# Patient Record
Sex: Male | Born: 1970 | Race: White | Hispanic: No | Marital: Married | Smoking: Never smoker
Health system: Southern US, Community
[De-identification: ages and names within clinical notes are randomized; demographics above are authoritative.]

## PROBLEM LIST (undated history)

## (undated) DIAGNOSIS — J45909 Unspecified asthma, uncomplicated: Secondary | ICD-10-CM

## (undated) HISTORY — PX: CHOLECYSTECTOMY: SHX55

## (undated) HISTORY — DX: Unspecified asthma, uncomplicated: J45.909

## (undated) HISTORY — PX: BILATERAL CARPAL TUNNEL RELEASE: SHX6508

## (undated) HISTORY — PX: APPENDECTOMY: SHX54

---

## 2013-06-17 ENCOUNTER — Emergency Department (HOSPITAL_COMMUNITY)
Admission: EM | Admit: 2013-06-17 | Discharge: 2013-06-17 | Disposition: A | Discharge status: Home / Self Care | Payer: 59 | Attending: Emergency Medicine | Admitting: Emergency Medicine

## 2013-06-17 ENCOUNTER — Encounter (HOSPITAL_COMMUNITY): Payer: Self-pay | Admitting: *Deleted

## 2013-06-17 ENCOUNTER — Emergency Department (HOSPITAL_COMMUNITY): Payer: 59

## 2013-06-17 DIAGNOSIS — Y9389 Activity, other specified: Secondary | ICD-10-CM | POA: Insufficient documentation

## 2013-06-17 DIAGNOSIS — X58XXXA Exposure to other specified factors, initial encounter: Secondary | ICD-10-CM | POA: Insufficient documentation

## 2013-06-17 DIAGNOSIS — Y929 Unspecified place or not applicable: Secondary | ICD-10-CM | POA: Insufficient documentation

## 2013-06-17 DIAGNOSIS — S6000XA Contusion of unspecified finger without damage to nail, initial encounter: Secondary | ICD-10-CM | POA: Insufficient documentation

## 2013-06-17 MED ORDER — HYDROCODONE-ACETAMINOPHEN 5-325 MG PO TABS: ORAL_TABLET | ORAL | Status: DC

## 2013-06-17 MED ORDER — ONDANSETRON HCL 4 MG PO TABS
4.0000 mg | ORAL_TABLET | Freq: Once | ORAL | Status: AC
  Administered 2013-06-17: 4 mg via ORAL
  Filled 2013-06-17: qty 1

## 2013-06-17 MED ORDER — KETOROLAC TROMETHAMINE 10 MG PO TABS
10.0000 mg | ORAL_TABLET | Freq: Once | ORAL | Status: AC
  Administered 2013-06-17: 10 mg via ORAL
  Filled 2013-06-17: qty 1

## 2013-06-17 MED ORDER — HYDROCODONE-ACETAMINOPHEN 5-325 MG PO TABS
2.0000 | ORAL_TABLET | Freq: Once | ORAL | Status: AC
  Administered 2013-06-17: 2 via ORAL
  Filled 2013-06-17: qty 2

## 2013-06-17 MED ORDER — DICLOFENAC SODIUM 75 MG PO TBEC: 75.0000 mg | DELAYED_RELEASE_TABLET | Freq: Two times a day (BID) | ORAL | Status: DC

## 2013-06-17 NOTE — ED Provider Notes (Signed)
History    CSN: [id number] Arrival date & time 06/17/13  2148  First MD Initiated Contact with Patient 06/17/13 2219     Chief Complaint  Patient presents with  . Hand Pain   (Consider location/radiation/quality/duration/timing/severity/associated sxs/prior Treatment) HPI Comments: Patient was working with a crossbow when he injured the right thumb. The patient states that throughout the day he has noticed increasing swelling and increasing pain. He presents at this time to have it evaluated for possible fracture. The patient denies being on any platelet altering medications. He does not have any bleeding disorders. He has not had any previous operations or procedures involving the right hand or right thumb. No other fingers were injured from this incident.  The history is provided by the patient.   History reviewed. No pertinent past medical history. Past Surgical History  Procedure Laterality Date  . Cholecystectomy    . Appendectomy     History reviewed. No pertinent family history. History  Substance Use Topics  . Smoking status: Not on file  . Smokeless tobacco: Current User  . Alcohol Use: Yes     Comment: occasional    Review of Systems  Constitutional: Negative for activity change.       All ROS Neg except as noted in HPI  HENT: Negative for nosebleeds and neck pain.   Eyes: Negative for photophobia and discharge.  Respiratory: Negative for cough, shortness of breath and wheezing.   Cardiovascular: Negative for chest pain and palpitations.  Gastrointestinal: Negative for abdominal pain and blood in stool.  Genitourinary: Negative for dysuria, frequency and hematuria.  Musculoskeletal: Negative for back pain and arthralgias.  Skin: Negative.   Neurological: Negative for dizziness, seizures and speech difficulty.  Psychiatric/Behavioral: Negative for hallucinations and confusion.    Allergies  Review of patient's allergies indicates no known allergies.  Home  Medications   Current Outpatient Rx  Name  Route  Sig  Dispense  Refill  . diclofenac (VOLTAREN) 75 MG EC tablet   Oral   Take 1 tablet (75 mg total) by mouth 2 (two) times daily.   12 tablet   0   . HYDROcodone-acetaminophen (NORCO/VICODIN) 5-325 MG per tablet      1 or 2 po q4h prn pain   20 tablet   0    BP 129/88  Pulse 66  Temp(Src) 98.7 F (37.1 C) (Oral)  Resp 16  Ht 5\' 8"  (1.727 m)  Wt 215 lb (97.523 kg)  BMI 32.7 kg/m2  SpO2 98% Physical Exam  Nursing note and vitals reviewed. Constitutional: He is oriented to person, place, and time. He appears well-developed and well-nourished.  Non-toxic appearance.  HENT:  Head: Normocephalic.  Right Ear: Tympanic membrane and external ear normal.  Left Ear: Tympanic membrane and external ear normal.  Eyes: EOM and lids are normal. Pupils are equal, round, and reactive to light.  Neck: Normal range of motion. Neck supple. Carotid bruit is not present.  Cardiovascular: Normal rate, regular rhythm, normal heart sounds, intact distal pulses and normal pulses.   Pulmonary/Chest: Breath sounds normal. No respiratory distress.  Abdominal: Soft. Bowel sounds are normal. There is no tenderness. There is no guarding.  Musculoskeletal: Normal range of motion.  There is full range of motion of the right shoulder elbow and wrists. There is pain to palpation medial more than lateral of the right thumb. There is no subungual hematoma appreciated at this time. There is good range of motion of the DIP. No swelling or  hematoma in the web space.  Lymphadenopathy:       Head (right side): No submandibular adenopathy present.       Head (left side): No submandibular adenopathy present.    He has no cervical adenopathy.  Neurological: He is alert and oriented to person, place, and time. He has normal strength. No cranial nerve deficit or sensory deficit.  Skin: Skin is warm and dry.  Psychiatric: He has a normal mood and affect. His speech is  normal.    ED Course  Procedures (including critical care time) Labs Reviewed - No data to display Dg Finger Thumb Right  06/17/2013   *RADIOLOGY REPORT*  Clinical Data: Right thumb injury, pain.  RIGHT THUMB 2+V  Comparison: None.  Findings: Imaged bones, joints and soft tissues appear normal.  IMPRESSION: Negative exam.   Original Report Authenticated By: Holley Dexter, M.D.   1. Contusion of finger of right hand, initial encounter     MDM  I have reviewed nursing notes, vital signs, and all appropriate lab and imaging results for this patient. Patient was shooting a crossbow and injured the right thumb. X-ray of the right thumb is negative for fracture or dislocation. Patient advised to apply ice, use diclofenac 2 times daily with food for inflammation, use Norco every 4 hours as needed for pain. Patient advised to see the orthopedic specialist if not improving.  Kathie Dike, PA-C 06/17/13 2334

## 2013-06-17 NOTE — ED Notes (Signed)
Pt with swelling and pain to right thumb after using a crossbow, unable to bend thumb, refuses ice pack, states that he put ice on it earlier and made pain worse per pt.

## 2013-06-17 NOTE — ED Notes (Signed)
Pt reports hurting right thumb when shooting a crossbow.  Right thumb swollen, no obvious deformity at this time.

## 2013-06-17 NOTE — ED Notes (Signed)
Discharge instructions given and reviewed with patient.  Prescriptions given for Voltaren and Hydrocodone; effects and use explained.  Patient verbalized understanding to take medications as directed, sedating effects of Hydrocodone and to follow up with orthopedics as needed.  Patient ambulatory; discharged home in good condition.

## 2013-06-18 NOTE — ED Provider Notes (Signed)
Medical screening examination/treatment/procedure(s) were performed by non-physician practitioner and as supervising physician I was immediately available for consultation/collaboration.   Joya Gaskins, MD 06/18/13 [phone]

## 2014-03-26 ENCOUNTER — Ambulatory Visit (HOSPITAL_COMMUNITY)
Admission: RE | Admit: 2014-03-26 | Discharge: 2014-03-26 | Disposition: A | Discharge status: Home / Self Care | Payer: 59 | Source: Ambulatory Visit | Attending: Orthopedic Surgery | Admitting: Orthopedic Surgery

## 2014-03-26 ENCOUNTER — Other Ambulatory Visit (HOSPITAL_COMMUNITY): Payer: Self-pay | Admitting: Orthopedic Surgery

## 2014-03-26 DIAGNOSIS — M545 Low back pain, unspecified: Secondary | ICD-10-CM

## 2014-03-26 DIAGNOSIS — Z01818 Encounter for other preprocedural examination: Secondary | ICD-10-CM | POA: Insufficient documentation

## 2014-03-26 DIAGNOSIS — Z77018 Contact with and (suspected) exposure to other hazardous metals: Secondary | ICD-10-CM | POA: Insufficient documentation

## 2016-12-18 HISTORY — PX: SHOULDER SURGERY: SHX246

## 2019-09-05 ENCOUNTER — Telehealth: Payer: Self-pay

## 2019-09-05 NOTE — Telephone Encounter (Signed)
NOTES ON FILE FROM DR Quillian Quince [facility] [phone], SENT REFERRAL TO SCHEDULING

## 2019-10-02 ENCOUNTER — Other Ambulatory Visit: Payer: Self-pay

## 2019-10-02 ENCOUNTER — Encounter: Payer: Self-pay | Admitting: Cardiovascular Disease

## 2019-10-02 ENCOUNTER — Ambulatory Visit: Payer: BC Managed Care – PPO | Admitting: Cardiovascular Disease

## 2019-10-02 VITALS — BP 114/78 | HR 72 | Temp 97.7°F | Ht 67.0 in | Wt 234.0 lb

## 2019-10-02 DIAGNOSIS — R0600 Dyspnea, unspecified: Secondary | ICD-10-CM | POA: Diagnosis not present

## 2019-10-02 DIAGNOSIS — R0609 Other forms of dyspnea: Secondary | ICD-10-CM | POA: Insufficient documentation

## 2019-10-02 DIAGNOSIS — R079 Chest pain, unspecified: Secondary | ICD-10-CM

## 2019-10-02 LAB — CBC
Hematocrit: 44.2 % (ref 37.5–51.0)
Hemoglobin: 14.9 g/dL (ref 13.0–17.7)
MCH: 30.3 pg (ref 26.6–33.0)
MCHC: 33.7 g/dL (ref 31.5–35.7)
MCV: 90 fL (ref 79–97)
Platelets: 237 10*3/uL (ref 150–450)
RBC: 4.92 x10E6/uL (ref 4.14–5.80)
RDW: 11.9 % (ref 11.6–15.4)
WBC: 7 10*3/uL (ref 3.4–10.8)

## 2019-10-02 LAB — BASIC METABOLIC PANEL
BUN/Creatinine Ratio: 12 (ref 9–20)
BUN: 11 mg/dL (ref 6–24)
CO2: 23 mmol/L (ref 20–29)
Calcium: 9 mg/dL (ref 8.7–10.2)
Chloride: 100 mmol/L (ref 96–106)
Creatinine, Ser: 0.91 mg/dL (ref 0.76–1.27)
GFR calc Af Amer: 115 mL/min/{1.73_m2} (ref >59)
GFR calc non Af Amer: 99 mL/min/{1.73_m2} (ref >59)
Glucose: 108 mg/dL — ABNORMAL HIGH (ref 65–99)
Potassium: 4 mmol/L (ref 3.5–5.2)
Sodium: 139 mmol/L (ref 134–144)

## 2019-10-02 MED ORDER — METOPROLOL TARTRATE 100 MG PO TABS: 100.0000 mg | ORAL_TABLET | Freq: Once | ORAL | 0 refills | Status: DC

## 2019-10-02 NOTE — Progress Notes (Signed)
10/02/2019 Waynard Reeds   July 12, 1971  [id number]  Primary Physician Patient, No Pcp Per Primary Cardiologist: Lorretta Harp MD [name], [place]  HPI:  Harsha Alcivar is a 48 y.o. mildly overweight married Caucasian male father of 65, grandfather 2 grandchildren who works as a Therapist, occupational at [employer] in [place].  He was referred by his PCP, Michell Heinrich DO for cardiovascular dilation because of chest pain and shortness of breath.  He has no cardiac risk factors other than family history.  His father died of a myocardial infarction at age 33.  Is never had a heart attack or stroke.  He does not smoke nor is he diabetic, hyperlipidemic or hypertensive.  He noticed increasing dyspnea on exertion over the last 6 months with more recent onset of chest pain with exertion.  He had a fairly severe episode this past Monday when walking up an incline with substernal chest pain rating to his back.   Current Meds  Medication Sig  . albuterol (VENTOLIN HFA) 108 (90 Base) MCG/ACT inhaler USE AS DIRECTED  . Azelastine-Fluticasone 137-50 MCG/ACT SUSP azelastine 137 mcg (0.1 %) nasal spray aerosol  . diclofenac (VOLTAREN) 75 MG EC tablet Take 1 tablet (75 mg total) by mouth 2 (two) times daily.  . fluticasone furoate-vilanterol (BREO ELLIPTA) 200-25 MCG/INH AEPB USE AS DIRECTED  . montelukast (SINGULAIR) 10 MG tablet Take 10 mg by mouth at bedtime.     No Known Allergies  Social History   Socioeconomic History  . Marital status: Married    Spouse name: Not on file  . Number of children: Not on file  . Years of education: Not on file  . Highest education level: Not on file  Occupational History  . Not on file  Social Needs  . Financial resource strain: Not on file  . Food insecurity    Worry: Not on file    Inability: Not on file  . Transportation needs    Medical: Not on file    Non-medical: Not on file  Tobacco Use  . Smoking status: Never Smoker  .  Smokeless tobacco: Current User    Types: Chew  Substance and Sexual Activity  . Alcohol use: Yes    Comment: occasional  . Drug use: No  . Sexual activity: Not on file  Lifestyle  . Physical activity    Days per week: Not on file    Minutes per session: Not on file  . Stress: Not on file  Relationships  . Social Herbalist on phone: Not on file    Gets together: Not on file    Attends religious service: Not on file    Active member of club or organization: Not on file    Attends meetings of clubs or organizations: Not on file    Relationship status: Not on file  . Intimate partner violence    Fear of current or ex partner: Not on file    Emotionally abused: Not on file    Physically abused: Not on file    Forced sexual activity: Not on file  Other Topics Concern  . Not on file  Social History Narrative  . Not on file     Review of Systems: General: negative for chills, fever, night sweats or weight changes.  Cardiovascular: negative for chest pain, dyspnea on exertion, edema, orthopnea, palpitations, paroxysmal nocturnal dyspnea or shortness of breath Dermatological: negative for rash Respiratory: negative for cough or  wheezing Urologic: negative for hematuria Abdominal: negative for nausea, vomiting, diarrhea, bright red blood per rectum, melena, or hematemesis Neurologic: negative for visual changes, syncope, or dizziness All other systems reviewed and are otherwise negative except as noted above.    Blood pressure 114/78, pulse 72, temperature 97.7 F (36.5 C), temperature source Temporal, height 5\' 7"  (1.702 m), weight 234 lb (106.1 kg), SpO2 95 %.  General appearance: alert and no distress Neck: no adenopathy, no carotid bruit, no JVD, supple, symmetrical, trachea midline and thyroid not enlarged, symmetric, no tenderness/mass/nodules Lungs: clear to auscultation bilaterally Heart: regular rate and rhythm, S1, S2 normal, no murmur, click, rub or gallop  Extremities: extremities normal, atraumatic, no cyanosis or edema Pulses: 2+ and symmetric Skin: Skin color, texture, turgor normal. No rashes or lesions Neurologic: Alert and oriented X 3, normal strength and tone. Normal symmetric reflexes. Normal coordination and gait  EKG sinus rhythm at 72 without ST or T wave changes.  I personally reviewed this EKG  ASSESSMENT AND PLAN:   Chest pain of uncertain etiology Mr. Widhalm was referred by Michell Heinrich , DO in [place] for new onset dyspnea and chest pain.  He has no cardiac risk factors other than family history the father died of myocardial infarction at age 51.  He is never had a heart attack or stroke.  He does not smoke.  He is noticed increased dyspnea on exertion over the last 3 to 6 months with increased exertional chest pain more recently.  He had fairly severe episodes that this past Monday while walking up a hill with pain that was substernal rating to his back along with shortness of breath.  I am going to get a 2D echo and a coronary CTA to further evaluate.      Lorretta Harp MD FACP,FACC,FAHA, [facility] 10/02/2019 9:54 AM

## 2019-10-02 NOTE — Patient Instructions (Addendum)
Your cardiac CT will be scheduled at one of the below locations:   [facility] [address] [place], [place] [address] (336) [place] [address] [place], [place] [address] [phone]  If scheduled at [facility], please arrive at the [facility] main entrance of [facility] 30-45 minutes prior to test start time. Proceed to the [facility] Radiology Department (first floor) to check-in and test prep.  If scheduled at [facility], please arrive 15 mins early for check-in and test prep.  Please follow these instructions carefully (unless otherwise directed):  Lab work: Your physician recommends that you HAVE LAB WORK TODAY: BASIC METABOLIC PANEL; COMPLETE BLOOD COUNT  If you have labs (blood work) drawn today and your tests are completely normal, you will receive your results only by: Marland Kitchen MyChart Message (if you have MyChart) OR . A paper copy in the mail If you have any lab test that is abnormal or we need to change your treatment, we will call you to review the results.  Hold all erectile dysfunction medications at least 3 days (72 hrs) prior to test.  On the Night Before the Test: . Be sure to Drink plenty of water. . Do not consume any caffeinated/decaffeinated beverages or chocolate 12 hours prior to your test. . Do not take any antihistamines 12 hours prior to your test.  On the Day of the Test: . Drink plenty of water. Do not drink any water within one hour of the test. . Do not eat any food 4 hours prior to the test. . You may take your regular medications prior to the test.  . Take metoprolol (Lopressor) 100 MG two hours prior to test.       After the Test: . Drink plenty of water. . After receiving IV contrast, you may experience a mild flushed feeling. This is normal. . On occasion, you may experience a mild rash up to 24 hours after the test.  This is not dangerous. If this occurs, you can take Benadryl 25 mg and increase your fluid intake. . If you experience trouble breathing, this can be serious. If it is severe call 911 IMMEDIATELY. If it is mild, please call our office.    Please contact the cardiac imaging nurse navigator should you have any questions/concerns Marchia Bond, RN Navigator Cardiac Imaging [facility] [phone] Office    ___________________________________________________________________  Testing/Procedures: Your physician has requested that you have an echocardiogram. Echocardiography is a painless test that uses sound waves to create images of your heart. It provides your doctor with information about the size and shape of your heart and how well your heart's chambers and valves are working. This procedure takes approximately one hour. There are no restrictions for this procedure. LOCATION: [facility]: [place], [place], [place] [address] TO BE SCHEDULED FOR APPOINTMENT WITHIN THE NEXT 1-2 WEEKS  Follow-Up: At [facility], you and your health needs are our priority.  As part of our continuing mission to provide you with exceptional heart care, we have created designated Provider Care Teams.  These Care Teams include your primary Cardiologist (physician) and Advanced Practice Providers (APPs -  Physician Assistants and Nurse Practitioners) who all work together to provide you with the care you need, when you need it. Marland Kitchen You will need a follow up appointment with Dr. Quay Burow AFTER YOUR ECHOCARDIOGRAM  AND CARDIAC CT. TO BE SCHEDULED

## 2019-10-02 NOTE — Assessment & Plan Note (Signed)
Michael Lyons was referred by Michell Heinrich , DO in [place] for new onset dyspnea and chest pain.  He has no cardiac risk factors other than family history the father died of myocardial infarction at age 48.  He is never had a heart attack or stroke.  He does not smoke.  He is noticed increased dyspnea on exertion over the last 3 to 6 months with increased exertional chest pain more recently.  He had fairly severe episodes that this past Monday while walking up a hill with pain that was substernal rating to his back along with shortness of breath.  I am going to get a 2D echo and a coronary CTA to further evaluate.

## 2019-10-08 ENCOUNTER — Other Ambulatory Visit (HOSPITAL_COMMUNITY): Payer: BC Managed Care – PPO

## 2019-10-08 ENCOUNTER — Ambulatory Visit (HOSPITAL_COMMUNITY): Payer: BC Managed Care – PPO | Attending: Cardiovascular Disease

## 2019-10-08 ENCOUNTER — Other Ambulatory Visit: Payer: Self-pay

## 2019-10-08 DIAGNOSIS — R079 Chest pain, unspecified: Secondary | ICD-10-CM | POA: Diagnosis not present

## 2019-11-03 ENCOUNTER — Telehealth (HOSPITAL_COMMUNITY): Payer: Self-pay | Admitting: Emergency Medicine

## 2019-11-03 ENCOUNTER — Telehealth: Payer: Self-pay | Admitting: Cardiovascular Disease

## 2019-11-03 NOTE — Telephone Encounter (Signed)
Left message on voicemail with name and callback number Justun Anaya RN Navigator Cardiac Imaging [facility] [phone] Office [phone] Cell  

## 2019-11-03 NOTE — Telephone Encounter (Signed)
I have can't find the pill I need to take for my ct on 11-05-19.

## 2019-11-04 ENCOUNTER — Other Ambulatory Visit: Payer: Self-pay

## 2019-11-04 MED ORDER — METOPROLOL TARTRATE 100 MG PO TABS: 100.0000 mg | ORAL_TABLET | Freq: Once | ORAL | 0 refills | Status: DC

## 2019-11-04 NOTE — Telephone Encounter (Signed)
Refill of metoprolol 100 mg sent to pt pharmacy on file. Reviewed cardiac ct instructions with pt. Pt took notes and verbalized understanding

## 2019-11-05 ENCOUNTER — Other Ambulatory Visit: Payer: Self-pay

## 2019-11-05 ENCOUNTER — Ambulatory Visit (HOSPITAL_COMMUNITY)
Admission: RE | Admit: 2019-11-05 | Discharge: 2019-11-05 | Disposition: A | Discharge status: Home / Self Care | Payer: BC Managed Care – PPO | Source: Ambulatory Visit | Attending: Cardiovascular Disease | Admitting: Cardiovascular Disease

## 2019-11-05 DIAGNOSIS — R079 Chest pain, unspecified: Secondary | ICD-10-CM | POA: Diagnosis not present

## 2019-11-05 MED ORDER — IOHEXOL 350 MG/ML SOLN
100.0000 mL | Freq: Once | INTRAVENOUS | Status: AC | PRN
  Administered 2019-11-05: 100 mL via INTRAVENOUS

## 2019-11-05 MED ORDER — NITROGLYCERIN 0.4 MG SL SUBL
SUBLINGUAL_TABLET | SUBLINGUAL | Status: AC
  Filled 2019-11-05: qty 2

## 2019-11-05 MED ORDER — NITROGLYCERIN 0.4 MG SL SUBL
0.8000 mg | SUBLINGUAL_TABLET | Freq: Once | SUBLINGUAL | Status: AC
  Administered 2019-11-05: 0.8 mg via SUBLINGUAL

## 2019-11-06 ENCOUNTER — Ambulatory Visit (HOSPITAL_COMMUNITY): Payer: BC Managed Care – PPO

## 2020-05-26 IMAGING — CT CT HEART MORP W/ CTA COR W/ SCORE W/ CA W/CM &/OR W/O CM
3 of 8 series · 6 of 20 positions shown, 7 images · IV contrast (APPLIED)
Comparison: None.
COMPARISON: None.

Addendum:
EXAM:
OVER-READ INTERPRETATION  CT CHEST

The following report is an over-read performed by radiologist Dr.
Aislinn Pledger [REDACTED] on 11/05/2019. This
over-read does not include interpretation of cardiac or coronary
anatomy or pathology. The coronary CTA interpretation by the
cardiologist is attached.
CLINICAL DATA: Chest pain
Cardiac CTA
MEDICATIONS:
Sub lingual nitro. 4mg x 2
TECHNIQUE: The patient was scanned on a Siemens [REDACTED]ice scanner. Gantry
rotation speed was 250 msecs. Collimation was 0.6 mm. A 100 kV
prospective scan was triggered in the ascending thoracic aorta at
35-75% of the R-R interval. Average HR during the scan was 60 bpm.
The 3D data set was interpreted on a dedicated work station using
MPR, MIP and VRT modes. A total of 80cc of contrast was used.

[Series 11: best syst 37 % · axial · 0.42mm/px · z∈[+1072,+1115]mm · 2 of 323 slices shown, 3 images]
[im 108/323  vessel]
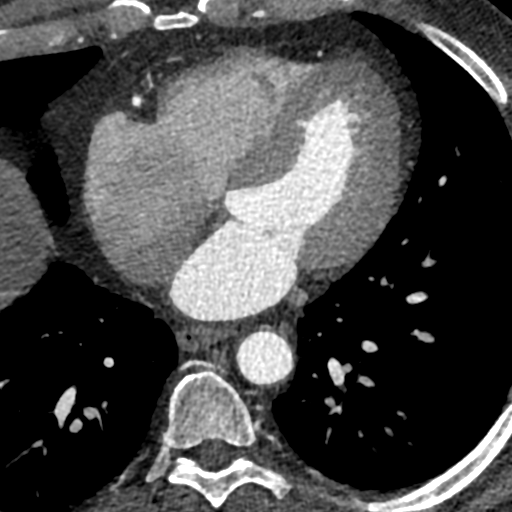
[im 108/323  lung]
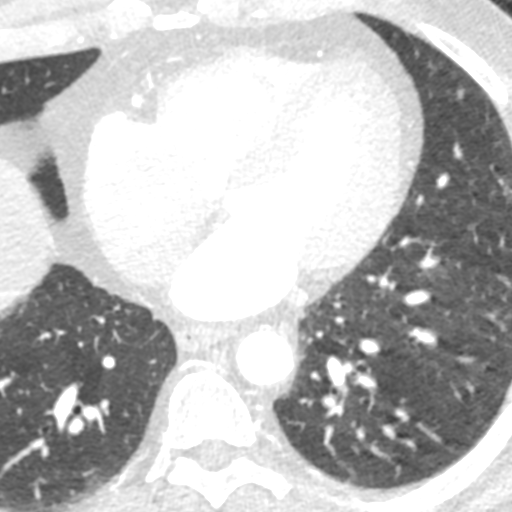
[im 215/323  vessel]
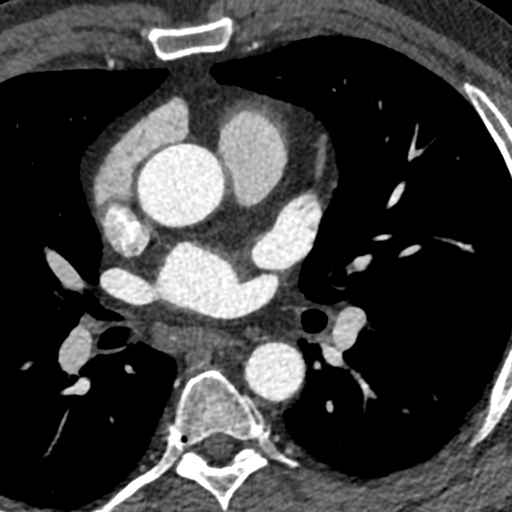

[Series 12: ts diast sharp 37 % · axial · 0.42mm/px · z∈[+1072,+1115]mm · 2 of 323 slices shown]
[im 108/323  lung]
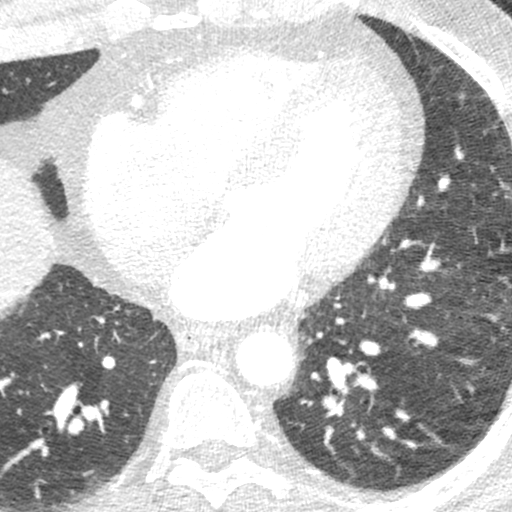
[im 215/323  lung]
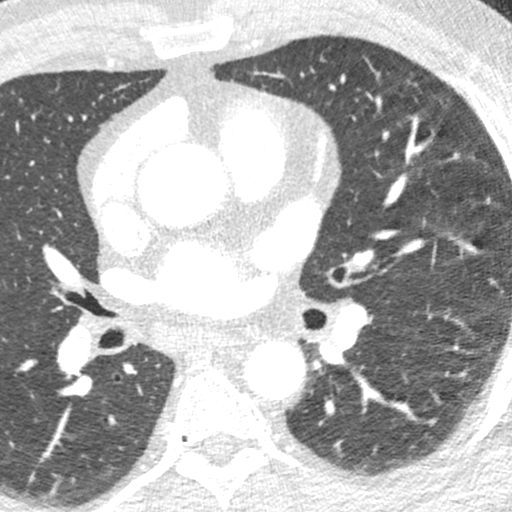

[Series 13: ts syst sharp 37 % · axial · 0.42mm/px · z∈[+1072,+1115]mm · 2 of 323 slices shown]
[im 108/323  lung]
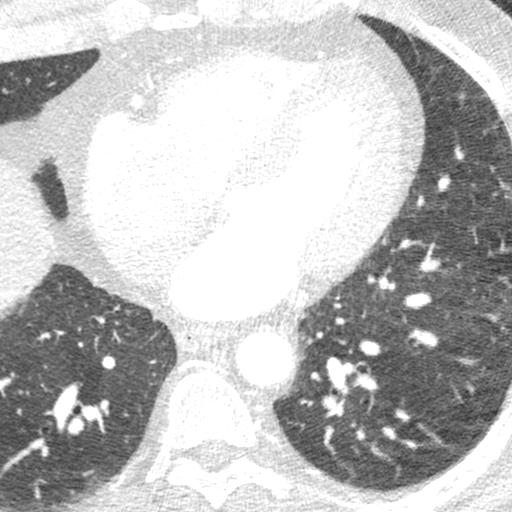
[im 215/323  lung]
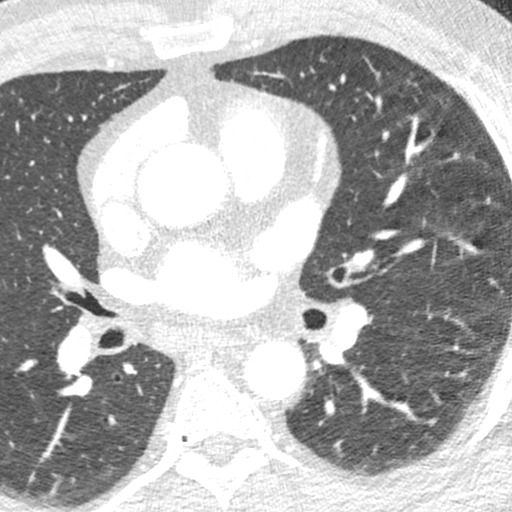

[6 of 20 positions shown; findings below may reference images not displayed]

FINDINGS: Vascular: No incidental findings.

Mediastinum/Nodes: Visualized mediastinum and hilar regions
demonstrate no lymphadenopathy or focal masses.

Lungs/Pleura: Visualized lungs show no evidence of pulmonary edema,
consolidation, pneumothorax, nodule or pleural fluid.

Upper Abdomen: Visualized upper liver demonstrates steatosis.

Musculoskeletal: No chest wall mass or suspicious bone lesions
identified.
IMPRESSION: No significant noncardiac findings in the visualized chest. Hepatic
steatosis.
FINDINGS: Non-cardiac: See separate report from [REDACTED].

Pulmonary veins drain normally to the left atrium.

Calcium Score: 0 Agatston units.

Coronary Arteries: Right dominant with no anomalies

LM: No plaque or stenosis.

LAD system:  No plaque or stenosis.

Circumflex system: No plaque or stenosis.

RCA system: No plaque or stenosis.
IMPRESSION: 1. Coronary artery calcium score 0 Agatston units. This suggests low
risk for future cardiac events.

2.  No significant coronary disease noted.

Irvin Billiot

*** End of Addendum ***
EXAM:
OVER-READ INTERPRETATION  CT CHEST

The following report is an over-read performed by radiologist Dr.
Aislinn Pledger [REDACTED] on 11/05/2019. This
over-read does not include interpretation of cardiac or coronary
anatomy or pathology. The coronary CTA interpretation by the
cardiologist is attached.
FINDINGS: Vascular: No incidental findings.

Mediastinum/Nodes: Visualized mediastinum and hilar regions
demonstrate no lymphadenopathy or focal masses.

Lungs/Pleura: Visualized lungs show no evidence of pulmonary edema,
consolidation, pneumothorax, nodule or pleural fluid.

Upper Abdomen: Visualized upper liver demonstrates steatosis.

Musculoskeletal: No chest wall mass or suspicious bone lesions
identified.
IMPRESSION: No significant noncardiac findings in the visualized chest. Hepatic
steatosis.

## 2020-05-27 ENCOUNTER — Telehealth: Payer: Self-pay | Admitting: Cardiovascular Disease

## 2020-05-27 NOTE — Telephone Encounter (Signed)
Pt c/o swelling: STAT is pt has developed SOB within 24 hours  1) How much weight have you gained and in what time span?  Not sure  2) If swelling, where is the swelling located? Legs and feet really bad-look like the vessels are popping  3) Are you currently taking a fluid pill? no  4) Are you currently SOB? Not much, having chest pains more often  5) Do you have a log of your daily weights (if so, list)? no  6) Have you gained 3 pounds in a day or 5 pounds in a week?  Not sure  7) Have you traveled recently? No- pt wants to be seen asap- 1st appt in person is 06-11-20

## 2020-05-27 NOTE — Telephone Encounter (Signed)
Spoke with patient's wife. No DPR on file, will need patient to call in. Did advise if patient is having chest pain to report to the emergency room.

## 2020-05-28 NOTE — Telephone Encounter (Signed)
Spoke with pt, he continues to have occasional chest pain. If occurs when he is active and will last up to 30 min and goes away with rest. His SOB has not changed as he has asthma. He also is c/o swelling in his feet and legs at the end of the day. He was wearing support socks but the swelling just moved up to the top of the socks. He feels something is not right even though all of his testing has come back normal. Sooner appointment scheduled with dr berry. Patient voiced understanding to go to the ER with any unresolved chest pain or change in symptoms.

## 2020-06-08 ENCOUNTER — Ambulatory Visit: Payer: BC Managed Care – PPO | Admitting: Cardiovascular Disease

## 2020-06-11 ENCOUNTER — Ambulatory Visit: Payer: BC Managed Care – PPO | Admitting: General Practice

## 2020-07-22 ENCOUNTER — Encounter: Payer: Self-pay | Admitting: Cardiovascular Disease

## 2020-07-22 ENCOUNTER — Ambulatory Visit (INDEPENDENT_AMBULATORY_CARE_PROVIDER_SITE_OTHER): Payer: BC Managed Care – PPO | Admitting: Cardiovascular Disease

## 2020-07-22 ENCOUNTER — Other Ambulatory Visit: Payer: Self-pay

## 2020-07-22 VITALS — BP 118/68 | HR 65 | Ht 68.0 in | Wt 232.0 lb

## 2020-07-22 DIAGNOSIS — Z79899 Other long term (current) drug therapy: Secondary | ICD-10-CM

## 2020-07-22 DIAGNOSIS — R600 Localized edema: Secondary | ICD-10-CM

## 2020-07-22 DIAGNOSIS — Z13220 Encounter for screening for lipoid disorders: Secondary | ICD-10-CM | POA: Diagnosis not present

## 2020-07-22 DIAGNOSIS — R0609 Other forms of dyspnea: Secondary | ICD-10-CM

## 2020-07-22 DIAGNOSIS — R0600 Dyspnea, unspecified: Secondary | ICD-10-CM | POA: Diagnosis not present

## 2020-07-22 MED ORDER — HYDROCHLOROTHIAZIDE 12.5 MG PO CAPS: 12.5000 mg | ORAL_CAPSULE | Freq: Every day | ORAL | 3 refills | Status: DC

## 2020-07-22 NOTE — Assessment & Plan Note (Signed)
History of bilateral lower extreme edema left greater than right.  Does have 1-2+ pitting edema.  I suspect he has excessive salt intake and dietary discretion.  His LV function is normal.  I am going to put him on low-dose hydrochlorothiazide 12.5 mg a day and we will check a basic metabolic panel in 2 weeks.  I will see him back in 3 months.

## 2020-07-22 NOTE — Patient Instructions (Addendum)
Medication Instructions:  START- Hydrochlorothiazide 12.5 mg by mouth daily  *If you need a refill on your cardiac medications before your next appointment, please call your pharmacy*   Lab Work: Fasting Lipid and Liver Today BMP in 2 weeks  If you have labs (blood work) drawn today and your tests are completely normal, you will receive your results only by: Marland Kitchen MyChart Message (if you have MyChart) OR . A paper copy in the mail If you have any lab test that is abnormal or we need to change your treatment, we will call you to review the results.   Testing/Procedures: None Ordered   Follow-Up: At [facility], you and your health needs are our priority.  As part of our continuing mission to provide you with exceptional heart care, we have created designated Provider Care Teams.  These Care Teams include your primary Cardiologist (physician) and Advanced Practice Providers (APPs -  Physician Assistants and Nurse Practitioners) who all work together to provide you with the care you need, when you need it.  We recommend signing up for the patient portal called "MyChart".  Sign up information is provided on this After Visit Summary.  MyChart is used to connect with patients for Virtual Visits (Telemedicine).  Patients are able to view lab/test results, encounter notes, upcoming appointments, etc.  Non-urgent messages can be sent to your provider as well.   To learn more about what you can do with MyChart, go to [email or web address].    Your next appointment:   3 month(s)  The format for your next appointment:   In Person  Provider:   You may see Quay Burow, MD or one of the following Advanced Practice Providers on your designated Care Team:    Kerin Ransom, PA-C  [place], [place]  Coletta Memos, [place]

## 2020-07-22 NOTE — Assessment & Plan Note (Signed)
I saw Michael Lyons a year ago for atypical chest pain and dyspnea.  A 2D echocardiogram performed 10/08/2019 revealed normal LV systolic function with impaired relaxation and normal valvular function.  Coronary calcium score was 0 suggesting that he had no significant CAD and that his chest pain was noncardiac.

## 2020-07-22 NOTE — Progress Notes (Signed)
07/22/2020 Michael Lyons   03-29-1971  [id number]  Primary Physician Michael Heinrich, Lyons Primary Cardiologist: Lorretta Harp MD Michael Lyons, [place]  HPI:  Michael Lyons is a 49 y.o.   mildly overweight married Caucasian male father of 47, grandfather 2 grandchildren who works as a Therapist, occupational at [employer] in [place].  He was referred by his PCP, Michael Lyons for cardiovascular dilation because of chest pain and shortness of breath.  10/02/2019 He has no cardiac risk factors other than family history.  His father died of a myocardial infarction at age 68.  Is never had a heart attack or stroke.  He does not smoke nor is he diabetic, hyperlipidemic or hypertensive.  He noticed increasing dyspnea on exertion over the last 6 months with more recent onset of chest pain with exertion.  He had a fairly severe episode this past Monday when walking up an incline with substernal chest pain rating to his back.  Since I saw him a year ago he did have a 2D echo performed 10/08/2019 that was essentially normal with some diastolic dysfunction.  A coronary calcium score performed 11/05/2019 was 0.  He continues to have some atypical chest pain.  He also complains of bilateral lower extreme edema left greater than right.   Current Meds  Medication Sig  . albuterol (VENTOLIN HFA) 108 (90 Base) MCG/ACT inhaler USE AS DIRECTED  . Azelastine-Fluticasone 137-50 MCG/ACT SUSP azelastine 137 mcg (0.1 %) nasal spray aerosol  . Mepolizumab (NUCALA) 100 MG/ML SOAJ Inject into the skin every 30 (thirty) days.  . montelukast (SINGULAIR) 10 MG tablet Take 10 mg by mouth at bedtime.     No Known Allergies  Social History   Socioeconomic History  . Marital status: Married    Spouse name: Not on file  . Number of children: Not on file  . Years of education: Not on file  . Highest education level: Not on file  Occupational History  . Not on file  Tobacco Use  . Smoking status: Never  Smoker  . Smokeless tobacco: Current User    Types: Chew  Substance and Sexual Activity  . Alcohol use: Yes    Comment: occasional  . Drug use: No  . Sexual activity: Not on file  Other Topics Concern  . Not on file  Social History Narrative  . Not on file   Social Determinants of Health   Financial Resource Strain:   . Difficulty of Paying Living Expenses:   Food Insecurity:   . Worried About Charity fundraiser in the Last Year:   . Arboriculturist in the Last Year:   Transportation Needs:   . Film/video editor (Medical):   Marland Kitchen Lack of Transportation (Non-Medical):   Physical Activity:   . Days of Exercise per Week:   . Minutes of Exercise per Session:   Stress:   . Feeling of Stress :   Social Connections:   . Frequency of Communication with Friends and Family:   . Frequency of Social Gatherings with Friends and Family:   . Attends Religious Services:   . Active Member of Clubs or Organizations:   . Attends Archivist Meetings:   Marland Kitchen Marital Status:   Intimate Partner Violence:   . Fear of Current or Ex-Partner:   . Emotionally Abused:   Marland Kitchen Physically Abused:   . Sexually Abused:      Review of Systems: General: negative for chills,  fever, night sweats or weight changes.  Cardiovascular: negative for chest pain, dyspnea on exertion, edema, orthopnea, palpitations, paroxysmal nocturnal dyspnea or shortness of breath Dermatological: negative for rash Respiratory: negative for cough or wheezing Urologic: negative for hematuria Abdominal: negative for nausea, vomiting, diarrhea, bright red blood per rectum, melena, or hematemesis Neurologic: negative for visual changes, syncope, or dizziness All other systems reviewed and are otherwise negative except as noted above.    Blood pressure 118/68, pulse 65, height 5\' 8"  (1.727 m), weight 232 lb (105.2 kg).  General appearance: alert and no distress Neck: no adenopathy, no carotid bruit, no JVD, supple,  symmetrical, trachea midline and thyroid not enlarged, symmetric, no tenderness/mass/nodules Lungs: clear to auscultation bilaterally Heart: regular rate and rhythm, S1, S2 normal, no murmur, click, rub or gallop Extremities: 1+ edema right, 2+ edema left Pulses: 2+ and symmetric Skin: Skin color, texture, turgor normal. No rashes or lesions Neurologic: Alert and oriented X 3, normal strength and tone. Normal symmetric reflexes. Normal coordination and gait  EKG sinus rhythm at 65 without ST or T wave changes.  I personally reviewed this EKG.  ASSESSMENT AND PLAN:   Chest pain of uncertain etiology I saw Michael Lyons a year ago for atypical chest pain and dyspnea.  A 2D echocardiogram performed 10/08/2019 revealed normal LV systolic function with impaired relaxation and normal valvular function.  Coronary calcium score was 0 suggesting that he had no significant CAD and that his chest pain was noncardiac.  Bilateral lower extremity edema History of bilateral lower extreme edema left greater than right.  Does have 1-2+ pitting edema.  I suspect he has excessive salt intake and dietary discretion.  His LV function is normal.  I am going to put him on low-dose hydrochlorothiazide 12.5 mg a day and we will check a basic metabolic panel in 2 weeks.  I will see him back in 3 months.      Lorretta Harp MD FACP,FACC,FAHA, [facility] 07/22/2020 11:38 AM

## 2020-07-23 LAB — LIPID PANEL
Chol/HDL Ratio: 5.2 ratio — ABNORMAL HIGH (ref 0.0–5.0)
Cholesterol, Total: 194 mg/dL (ref 100–199)
HDL: 37 mg/dL — ABNORMAL LOW (ref >39)
LDL Chol Calc (NIH): 109 mg/dL — ABNORMAL HIGH (ref 0–99)
Triglycerides: 279 mg/dL — ABNORMAL HIGH (ref 0–149)
VLDL Cholesterol Cal: 48 mg/dL — ABNORMAL HIGH (ref 5–40)

## 2020-07-23 LAB — HEPATIC FUNCTION PANEL
ALT: 35 IU/L (ref 0–44)
AST: 24 IU/L (ref 0–40)
Albumin: 4.4 g/dL (ref 4.0–5.0)
Alkaline Phosphatase: 77 IU/L (ref 48–121)
Bilirubin Total: 0.5 mg/dL (ref 0.0–1.2)
Bilirubin, Direct: 0.13 mg/dL (ref 0.00–0.40)
Total Protein: 6.5 g/dL (ref 6.0–8.5)

## 2020-08-05 DIAGNOSIS — Z79899 Other long term (current) drug therapy: Secondary | ICD-10-CM | POA: Diagnosis not present

## 2020-08-06 LAB — BASIC METABOLIC PANEL
BUN/Creatinine Ratio: 11 (ref 9–20)
BUN: 9 mg/dL (ref 6–24)
CO2: 20 mmol/L (ref 20–29)
Calcium: 8.9 mg/dL (ref 8.7–10.2)
Chloride: 101 mmol/L (ref 96–106)
Creatinine, Ser: 0.8 mg/dL (ref 0.76–1.27)
GFR calc Af Amer: 121 mL/min/{1.73_m2} (ref >59)
GFR calc non Af Amer: 105 mL/min/{1.73_m2} (ref >59)
Glucose: 130 mg/dL — ABNORMAL HIGH (ref 65–99)
Potassium: 4.1 mmol/L (ref 3.5–5.2)
Sodium: 141 mmol/L (ref 134–144)

## 2020-10-21 DIAGNOSIS — M25562 Pain in left knee: Secondary | ICD-10-CM | POA: Diagnosis not present

## 2020-10-22 ENCOUNTER — Ambulatory Visit: Payer: BC Managed Care – PPO | Admitting: Cardiovascular Disease

## 2020-10-26 ENCOUNTER — Ambulatory Visit: Payer: BC Managed Care – PPO | Admitting: Cardiovascular Disease

## 2022-01-25 DIAGNOSIS — J45909 Unspecified asthma, uncomplicated: Secondary | ICD-10-CM | POA: Diagnosis not present

## 2022-01-25 DIAGNOSIS — R5383 Other fatigue: Secondary | ICD-10-CM | POA: Diagnosis not present

## 2022-01-25 DIAGNOSIS — Z6834 Body mass index (BMI) 34.0-34.9, adult: Secondary | ICD-10-CM | POA: Diagnosis not present

## 2022-01-25 DIAGNOSIS — E781 Pure hyperglyceridemia: Secondary | ICD-10-CM | POA: Diagnosis not present

## 2022-02-17 DIAGNOSIS — Z0001 Encounter for general adult medical examination with abnormal findings: Secondary | ICD-10-CM | POA: Diagnosis not present

## 2022-02-17 DIAGNOSIS — E781 Pure hyperglyceridemia: Secondary | ICD-10-CM | POA: Diagnosis not present

## 2022-02-17 DIAGNOSIS — Z125 Encounter for screening for malignant neoplasm of prostate: Secondary | ICD-10-CM | POA: Diagnosis not present

## 2022-02-17 DIAGNOSIS — R5383 Other fatigue: Secondary | ICD-10-CM | POA: Diagnosis not present

## 2022-02-17 DIAGNOSIS — L138 Other specified bullous disorders: Secondary | ICD-10-CM | POA: Diagnosis not present

## 2022-02-22 DIAGNOSIS — J45909 Unspecified asthma, uncomplicated: Secondary | ICD-10-CM | POA: Diagnosis not present

## 2022-02-22 DIAGNOSIS — J209 Acute bronchitis, unspecified: Secondary | ICD-10-CM | POA: Diagnosis not present

## 2022-02-22 DIAGNOSIS — Z6834 Body mass index (BMI) 34.0-34.9, adult: Secondary | ICD-10-CM | POA: Diagnosis not present

## 2022-03-03 DIAGNOSIS — Z0000 Encounter for general adult medical examination without abnormal findings: Secondary | ICD-10-CM | POA: Diagnosis not present

## 2022-03-09 DIAGNOSIS — E291 Testicular hypofunction: Secondary | ICD-10-CM | POA: Diagnosis not present

## 2022-03-28 DIAGNOSIS — J209 Acute bronchitis, unspecified: Secondary | ICD-10-CM | POA: Diagnosis not present

## 2022-03-28 DIAGNOSIS — Z1211 Encounter for screening for malignant neoplasm of colon: Secondary | ICD-10-CM | POA: Diagnosis not present

## 2022-03-28 DIAGNOSIS — I10 Essential (primary) hypertension: Secondary | ICD-10-CM | POA: Diagnosis not present

## 2022-03-28 DIAGNOSIS — Z6833 Body mass index (BMI) 33.0-33.9, adult: Secondary | ICD-10-CM | POA: Diagnosis not present

## 2022-03-28 DIAGNOSIS — J019 Acute sinusitis, unspecified: Secondary | ICD-10-CM | POA: Diagnosis not present

## 2022-03-28 DIAGNOSIS — R58 Hemorrhage, not elsewhere classified: Secondary | ICD-10-CM | POA: Diagnosis not present

## 2022-04-10 DIAGNOSIS — E291 Testicular hypofunction: Secondary | ICD-10-CM | POA: Diagnosis not present

## 2022-04-24 DIAGNOSIS — D128 Benign neoplasm of rectum: Secondary | ICD-10-CM | POA: Diagnosis not present

## 2022-04-24 DIAGNOSIS — Z1211 Encounter for screening for malignant neoplasm of colon: Secondary | ICD-10-CM | POA: Diagnosis not present

## 2022-04-24 DIAGNOSIS — Z79899 Other long term (current) drug therapy: Secondary | ICD-10-CM | POA: Diagnosis not present

## 2022-04-24 DIAGNOSIS — D123 Benign neoplasm of transverse colon: Secondary | ICD-10-CM | POA: Diagnosis not present

## 2022-04-24 DIAGNOSIS — D122 Benign neoplasm of ascending colon: Secondary | ICD-10-CM | POA: Diagnosis not present

## 2022-04-24 DIAGNOSIS — D124 Benign neoplasm of descending colon: Secondary | ICD-10-CM | POA: Diagnosis not present

## 2022-04-24 DIAGNOSIS — F17220 Nicotine dependence, chewing tobacco, uncomplicated: Secondary | ICD-10-CM | POA: Diagnosis not present

## 2022-04-24 DIAGNOSIS — K644 Residual hemorrhoidal skin tags: Secondary | ICD-10-CM | POA: Diagnosis not present

## 2022-04-24 DIAGNOSIS — J45909 Unspecified asthma, uncomplicated: Secondary | ICD-10-CM | POA: Diagnosis not present

## 2022-04-24 DIAGNOSIS — Z6833 Body mass index (BMI) 33.0-33.9, adult: Secondary | ICD-10-CM | POA: Diagnosis not present

## 2022-04-24 DIAGNOSIS — E669 Obesity, unspecified: Secondary | ICD-10-CM | POA: Diagnosis not present

## 2022-04-24 DIAGNOSIS — K635 Polyp of colon: Secondary | ICD-10-CM | POA: Diagnosis not present

## 2022-05-09 DIAGNOSIS — Z6833 Body mass index (BMI) 33.0-33.9, adult: Secondary | ICD-10-CM | POA: Diagnosis not present

## 2022-05-09 DIAGNOSIS — E291 Testicular hypofunction: Secondary | ICD-10-CM | POA: Diagnosis not present

## 2022-05-09 DIAGNOSIS — D126 Benign neoplasm of colon, unspecified: Secondary | ICD-10-CM | POA: Diagnosis not present

## 2022-05-09 DIAGNOSIS — D369 Benign neoplasm, unspecified site: Secondary | ICD-10-CM | POA: Diagnosis not present

## 2022-06-06 DIAGNOSIS — R21 Rash and other nonspecific skin eruption: Secondary | ICD-10-CM | POA: Diagnosis not present

## 2022-06-06 DIAGNOSIS — R5383 Other fatigue: Secondary | ICD-10-CM | POA: Diagnosis not present

## 2022-06-06 DIAGNOSIS — E781 Pure hyperglyceridemia: Secondary | ICD-10-CM | POA: Diagnosis not present

## 2022-06-06 DIAGNOSIS — E7800 Pure hypercholesterolemia, unspecified: Secondary | ICD-10-CM | POA: Diagnosis not present

## 2022-06-06 DIAGNOSIS — R7989 Other specified abnormal findings of blood chemistry: Secondary | ICD-10-CM | POA: Diagnosis not present

## 2022-06-06 DIAGNOSIS — T7840XA Allergy, unspecified, initial encounter: Secondary | ICD-10-CM | POA: Diagnosis not present

## 2022-06-06 DIAGNOSIS — Z209 Contact with and (suspected) exposure to unspecified communicable disease: Secondary | ICD-10-CM | POA: Diagnosis not present

## 2022-07-04 DIAGNOSIS — E7801 Familial hypercholesterolemia: Secondary | ICD-10-CM | POA: Diagnosis not present

## 2022-07-04 DIAGNOSIS — E781 Pure hyperglyceridemia: Secondary | ICD-10-CM | POA: Diagnosis not present

## 2022-07-04 DIAGNOSIS — E291 Testicular hypofunction: Secondary | ICD-10-CM | POA: Diagnosis not present

## 2022-07-04 DIAGNOSIS — R5383 Other fatigue: Secondary | ICD-10-CM | POA: Diagnosis not present

## 2022-07-18 DIAGNOSIS — E291 Testicular hypofunction: Secondary | ICD-10-CM | POA: Diagnosis not present

## 2022-07-20 ENCOUNTER — Encounter (INDEPENDENT_AMBULATORY_CARE_PROVIDER_SITE_OTHER): Payer: Self-pay | Admitting: *Deleted

## 2022-08-01 DIAGNOSIS — E291 Testicular hypofunction: Secondary | ICD-10-CM | POA: Diagnosis not present

## 2022-08-15 DIAGNOSIS — E291 Testicular hypofunction: Secondary | ICD-10-CM | POA: Diagnosis not present

## 2022-08-29 DIAGNOSIS — E291 Testicular hypofunction: Secondary | ICD-10-CM | POA: Diagnosis not present

## 2022-09-26 DIAGNOSIS — E291 Testicular hypofunction: Secondary | ICD-10-CM | POA: Diagnosis not present

## 2022-10-10 ENCOUNTER — Ambulatory Visit (INDEPENDENT_AMBULATORY_CARE_PROVIDER_SITE_OTHER): Payer: BC Managed Care – PPO | Admitting: Gastroenterology

## 2022-10-10 ENCOUNTER — Encounter (INDEPENDENT_AMBULATORY_CARE_PROVIDER_SITE_OTHER): Payer: Self-pay

## 2022-10-10 ENCOUNTER — Other Ambulatory Visit (INDEPENDENT_AMBULATORY_CARE_PROVIDER_SITE_OTHER): Payer: Self-pay

## 2022-10-10 ENCOUNTER — Encounter (INDEPENDENT_AMBULATORY_CARE_PROVIDER_SITE_OTHER): Payer: Self-pay | Admitting: Gastroenterology

## 2022-10-10 VITALS — BP 124/85 | HR 81 | Temp 98.1°F | Ht 68.0 in | Wt 208.6 lb

## 2022-10-10 DIAGNOSIS — Z1211 Encounter for screening for malignant neoplasm of colon: Secondary | ICD-10-CM

## 2022-10-10 DIAGNOSIS — Z91018 Allergy to other foods: Secondary | ICD-10-CM

## 2022-10-10 DIAGNOSIS — Z86010 Personal history of colonic polyps: Secondary | ICD-10-CM | POA: Diagnosis not present

## 2022-10-10 DIAGNOSIS — E291 Testicular hypofunction: Secondary | ICD-10-CM | POA: Diagnosis not present

## 2022-10-10 DIAGNOSIS — R197 Diarrhea, unspecified: Secondary | ICD-10-CM | POA: Diagnosis not present

## 2022-10-10 NOTE — Progress Notes (Signed)
Referring Provider: Michell Heinrich, DO Primary Care Physician:  Denny Levy, [place] Primary GI Physician: new  Chief Complaint  Patient presents with   Colon Polyps    Had polyps removed during colonoscopy by surgeon in [facility] and was referred to GI.    HPI:   Michael Lyons is a 51 y.o. male with past medical history of asthma, sleep apnea (does not wear CPAP as he did not tolerate)  Patient presenting today as a new patient, referred by Dr. Rocco Serene after colonoscopy in may with multiple polyps.   Notably with positive alpha gal testing in June 2023, LFTs WNL, TSH 5.270.   Patient reports he had a colonoscopy with multiple polyps, one was 88m. He states that he was told he needed a repeat colonoscopy.  He does note that he has issues with diarrhea since having cholecystectomy a few years ago. Has fecal urgency, up to 3 BMs per day, stools are almost always looser. Denies abdominal pain. Does not matter what he eats in regards to his diarrhea, sometimes can have it with just drinking liquids. Has occasional toilet tissue hematochezia, recent colonoscopy with presence of hemorrhoids. Denies melena. He is trying to cut back on carbs and is losing some weight doing this. Trying to avoid eating late. He notes history of alpha gal, he requested testing as he is out in the woods all the time, though states he still continues to eat meat. He does not have hives or sob when he eats meat and has no plans to stop consumption. Denies nausea, vomiting, early satiety, dysphagia or odynophagia.   Patient does report daily alcohol use, mostly with beer but sometimes drinks some liquor, started drinking frequently a few years ago.   NSAID use: none Social hx: no tobacco, drinks 4-10 beers per day, occasionally a few shots of liquor Fam hx: mother has significant history of polyps   Last Colonoscopy: May 2023, Dr. SRocco Serene A digital rectal exam was performed. External hemorrhoids.  - One 5 mm polyp  at the hepatic flexure (tubular adenoma)- One 10 mm polyp in the proximal ascending colon (colonic mucosa), - One 4 mm polyp in the proximal transverse colon (tubulovillous adenoma),- Two 3 to 6 mm polyps at 80 cm proximal to the anus (tubular adenoma), - Two 2 to 3 mm polyps in the rectum (tubular adenoma, hyperplastic polyp, colonic mucosa) Last Endoscopy:never  Past Medical History:  Diagnosis Date   Asthma     Past Surgical History:  Procedure Laterality Date   APPENDECTOMY     BILATERAL CARPAL TUNNEL RELEASE     CHOLECYSTECTOMY     SHOULDER SURGERY Right 2018    Current Outpatient Medications  Medication Sig Dispense Refill   albuterol (VENTOLIN HFA) 108 (90 Base) MCG/ACT inhaler USE AS DIRECTED     Mepolizumab (NUCALA) 100 MG/ML SOAJ Inject into the skin every 30 (thirty) days.     montelukast (SINGULAIR) 10 MG tablet Take 10 mg by mouth. Takes as needed     testosterone cypionate (DEPOTESTOSTERONE CYPIONATE) 200 MG/ML injection every 14 (fourteen) days.     No current facility-administered medications for this visit.    Allergies as of 10/10/2022   (No Known Allergies)    Family History  Problem Relation Age of Onset   Asthma Mother    Heart attack Father    Anemia Neg Hx    Arrhythmia Neg Hx    Clotting disorder Neg Hx    Fainting Neg Hx  Heart disease Neg Hx    Heart failure Neg Hx    Hyperlipidemia Neg Hx    Hypertension Neg Hx     Social History   Socioeconomic History   Marital status: Married    Spouse name: Not on file   Number of children: Not on file   Years of education: Not on file   Highest education level: Not on file  Occupational History   Not on file  Tobacco Use   Smoking status: Never    Passive exposure: Past   Smokeless tobacco: Current    Types: Chew  Substance and Sexual Activity   Alcohol use: Yes    Comment: occasional   Drug use: No   Sexual activity: Not on file  Other Topics Concern   Not on file  Social History  Narrative   Not on file   Social Determinants of Health   Financial Resource Strain: Not on file  Food Insecurity: Not on file  Transportation Needs: Not on file  Physical Activity: Not on file  Stress: Not on file  Social Connections: Not on file   Review of systems General: negative for malaise, night sweats, fever, chills, weight loss Neck: Negative for lumps, goiter, pain and significant neck swelling Resp: Negative for cough, wheezing, dyspnea at rest CV: Negative for chest pain, leg swelling, palpitations, orthopnea GI: denies melena, nausea, vomiting,constipation, dysphagia, odyonophagia, early satiety or unintentional weight loss. +hematochezia +diarrhea MSK: Negative for joint pain or swelling, back pain, and muscle pain. Derm: Negative for itching or rash Psych: Denies depression, anxiety, memory loss, confusion. No homicidal or suicidal ideation.  Heme: Negative for prolonged bleeding, bruising easily, and swollen nodes. Endocrine: Negative for cold or heat intolerance, polyuria, polydipsia and goiter. Neuro: negative for tremor, gait imbalance, syncope and seizures. The remainder of the review of systems is noncontributory.  Physical Exam: BP 124/85 (BP Location: Left Arm, Patient Position: Sitting, Cuff Size: Large)   Pulse 81   Temp 98.1 F (36.7 C) (Oral)   Ht '5\' 8"'$  (1.727 m)   Wt 208 lb 9.6 oz (94.6 kg)   BMI 31.72 kg/m  General:   Alert and oriented. No distress noted. Pleasant and cooperative.  Head:  Normocephalic and atraumatic. Eyes:  Conjuctiva clear without scleral icterus. Mouth:  Oral mucosa pink and moist. Good dentition. No lesions. Heart: Normal rate and rhythm, s1 and s2 heart sounds present.  Lungs: Clear lung sounds in all lobes. Respirations equal and unlabored. Abdomen:  +BS, soft, non-tender and non-distended. No rebound or guarding. No HSM or masses noted. Derm: No palmar erythema or jaundice Msk:  Symmetrical without gross deformities.  Normal posture. Extremities:  Without edema. Neurologic:  Alert and  oriented x4 Psych:  Alert and cooperative. Normal mood and affect.  Invalid input(s): "6 MONTHS"   ASSESSMENT: Michael Lyons is a 51 y.o. male presenting today as a new patient for repeat colonoscopy due to presence of multiple polyps/tubular adenomas.  Recent colonoscopy in May with Dr. Rocco Serene at [facility] with multiple polyps, largest at 2m, referred for repeat colonoscopy due to amount and size of polyps. Indications, risks and benefits of procedure discussed in detail with patient. Patient verbalized understanding and is in agreement to proceed with repeat Colonoscopy at this time.   patient reports diarrhea and fecal urgency since cholecystectomy a few years ago, also with recent alpha gal testing that was positive, though he says he has not stopped eating meat and does not plan to. He denies  any anaphylactic reactions, no hives or SOB. 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 I did encourage him to avoid meat and made him aware that allergy can potentially worsen in some patients with presence of worsening side effects occurring with continued consumption of meat. Also discussed use of bile acid sequestrant to help with his diarrhea though he is not interested at this time. He denies any abdominal pain, nausea, or vomiting.   He does report that he drinks 4-10 beers per day, sometimes a few shots of liquor, has been doing this for the past few days. Does not feel that his drinking is an issue as he is able to limit himself if he feels like it. I discussed adverse effects on the liver with frequent, heavy consumption of alcohol to include fatty liver and potential progression to cirrhosis in some patients. We discussed importance of limiting alcohol intake to avoid potential adverse effects.    PLAN:  Repeat  colonoscopy-ASA III, ENDO 3 (heavy ETOH, Sleep apnea) 2. 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 Need to decrease alcohol intake  4. Pt to let me know if he wishes to try bile acid sequestrant for diarrhea  All questions were answered, patient verbalized understanding and is in agreement with plan as outlined above.   Follow Up: TBD  Vicki Chaffin L. Alver Sorrow, MSN, APRN, AGNP-C Adult-Gerontology Nurse Practitioner [facility]  I have reviewed the note and agree with the APP's assessment as described in this progress note  Maylon Peppers, MD Gastroenterology and Hepatology [facility]

## 2022-10-10 NOTE — Patient Instructions (Signed)
It was nice to meet you! Given findings on last colonoscopy we will get you scheduled for repeat to ensure that all polyps were removed completely As discussed, with alpha gal, this can cause diarrhea, there is likely a component of diarrhea occurring due to lack of gallbladder, if you decide you would like to try the medication for this that we discussed you can let me know. In some cases alpha gal can worsen and you can develop reactions such as swelling, hives and shortness of breath so be mindful of this. I would recommend decreasing alcohol intake as you are putting yourself at risk for potential liver damage in the future given your frequent/high intake of alcohol.  Follow up to be determined after colonoscopy

## 2022-10-24 DIAGNOSIS — E291 Testicular hypofunction: Secondary | ICD-10-CM | POA: Diagnosis not present

## 2022-11-07 DIAGNOSIS — E291 Testicular hypofunction: Secondary | ICD-10-CM | POA: Diagnosis not present

## 2022-11-16 ENCOUNTER — Encounter (HOSPITAL_COMMUNITY): Payer: Self-pay

## 2022-11-16 ENCOUNTER — Other Ambulatory Visit: Payer: Self-pay

## 2022-11-16 ENCOUNTER — Encounter (HOSPITAL_COMMUNITY)
Admission: RE | Admit: 2022-11-16 | Discharge: 2022-11-16 | Disposition: A | Discharge status: Home / Self Care | Payer: BC Managed Care – PPO | Source: Ambulatory Visit | Attending: Gastroenterology | Admitting: Gastroenterology

## 2022-11-21 ENCOUNTER — Encounter (HOSPITAL_COMMUNITY)
Admission: RE | Disposition: A | Discharge status: Home / Self Care | Payer: Self-pay | Source: Home / Self Care | Attending: Gastroenterology

## 2022-11-21 ENCOUNTER — Ambulatory Visit (HOSPITAL_COMMUNITY): Payer: BC Managed Care – PPO | Admitting: Anesthesiology

## 2022-11-21 ENCOUNTER — Encounter (HOSPITAL_COMMUNITY): Payer: Self-pay | Admitting: Gastroenterology

## 2022-11-21 ENCOUNTER — Ambulatory Visit (HOSPITAL_COMMUNITY)
Admission: RE | Admit: 2022-11-21 | Discharge: 2022-11-21 | Disposition: A | Discharge status: Home / Self Care | Payer: BC Managed Care – PPO | Attending: Gastroenterology | Admitting: Gastroenterology

## 2022-11-21 DIAGNOSIS — Z1211 Encounter for screening for malignant neoplasm of colon: Secondary | ICD-10-CM

## 2022-11-21 DIAGNOSIS — Z09 Encounter for follow-up examination after completed treatment for conditions other than malignant neoplasm: Secondary | ICD-10-CM | POA: Diagnosis not present

## 2022-11-21 DIAGNOSIS — J45909 Unspecified asthma, uncomplicated: Secondary | ICD-10-CM | POA: Insufficient documentation

## 2022-11-21 DIAGNOSIS — D125 Benign neoplasm of sigmoid colon: Secondary | ICD-10-CM | POA: Diagnosis not present

## 2022-11-21 DIAGNOSIS — D124 Benign neoplasm of descending colon: Secondary | ICD-10-CM | POA: Insufficient documentation

## 2022-11-21 DIAGNOSIS — G4730 Sleep apnea, unspecified: Secondary | ICD-10-CM | POA: Diagnosis not present

## 2022-11-21 DIAGNOSIS — Z86010 Personal history of colonic polyps: Secondary | ICD-10-CM | POA: Diagnosis not present

## 2022-11-21 DIAGNOSIS — D214 Benign neoplasm of connective and other soft tissue of abdomen: Secondary | ICD-10-CM | POA: Diagnosis not present

## 2022-11-21 HISTORY — PX: COLONOSCOPY WITH PROPOFOL: SHX5780

## 2022-11-21 HISTORY — PX: POLYPECTOMY: SHX149

## 2022-11-21 LAB — HM COLONOSCOPY

## 2022-11-21 SURGERY — COLONOSCOPY WITH PROPOFOL
Anesthesia: General

## 2022-11-21 MED ORDER — LIDOCAINE HCL (CARDIAC) PF 100 MG/5ML IV SOSY
PREFILLED_SYRINGE | INTRAVENOUS | Status: DC | PRN
  Administered 2022-11-21: 50 mg via INTRAVENOUS

## 2022-11-21 MED ORDER — STERILE WATER FOR IRRIGATION IR SOLN
Status: DC | PRN
  Administered 2022-11-21 (×3): 60 mL

## 2022-11-21 MED ORDER — PROPOFOL 500 MG/50ML IV EMUL
INTRAVENOUS | Status: DC | PRN
  Administered 2022-11-21: 150 ug/kg/min via INTRAVENOUS

## 2022-11-21 MED ORDER — LACTATED RINGERS IV SOLN: INTRAVENOUS | Status: DC

## 2022-11-21 MED ORDER — PROPOFOL 10 MG/ML IV BOLUS
INTRAVENOUS | Status: DC | PRN
  Administered 2022-11-21 (×4): 50 mg via INTRAVENOUS

## 2022-11-21 MED ORDER — LACTATED RINGERS IV SOLN: INTRAVENOUS | Status: DC | PRN

## 2022-11-21 NOTE — Op Note (Signed)
[facility] Patient Name: Michael Lyons Procedure Date: 11/21/2022 1:30 PM MRN: [id number] Date of Birth: 18-Jun-1971 Attending MD: Maylon Peppers , , [id number] CSN: [id number] Age: 51 Admit Type: Outpatient Procedure:                Colonoscopy Indications:              Surveillance: Personal history of adenomatous                            polyps, inadequate prep on last colonoscopy (less                            than 1 year ago) Providers:                Maylon Peppers, Crystal Page, Raphael Gibney,                            Technician Referring MD:             Maylon Peppers Medicines:                Monitored Anesthesia Care Complications:            No immediate complications. Estimated Blood Loss:     Estimated blood loss: none. Procedure:                Pre-Anesthesia Assessment:                           - Prior to the procedure, a History and Physical                            was performed, and patient medications, allergies                            and sensitivities were reviewed. The patient's                            tolerance of previous anesthesia was reviewed.                           - The risks and benefits of the procedure and the                            sedation options and risks were discussed with the                            patient. All questions were answered and informed                            consent was obtained.                           - ASA Grade Assessment: II - A patient with mild                            systemic disease.  After obtaining informed consent, the colonoscope                            was passed under direct vision. Throughout the                            procedure, the patient's blood pressure, pulse, and                            oxygen saturations were monitored continuously. The                            [id number]-HQ190L ([id number]) scope was introduced through                             the anus and advanced to the the cecum, identified                            by appendiceal orifice and ileocecal valve. The                            colonoscopy was performed without difficulty. The                            patient tolerated the procedure well. The quality                            of the bowel preparation was adequate to identify                            polyps greater than 5 mm in size. Scope In: 1:41:10 PM Scope Out: 2:05:02 PM Scope Withdrawal Time: 0 hours 18 minutes 17 seconds  Total Procedure Duration: 0 hours 23 minutes 52 seconds  Findings:      The perianal and digital rectal examinations were normal.      Three sessile polyps were found in the sigmoid colon and descending       colon. The polyps were 3 to 6 mm in size. These polyps were removed with       a cold snare. Resection and retrieval were complete.      The retroflexed view of the distal rectum and anal verge was normal and       showed no anal or rectal abnormalities. Impression:               - Three 3 to 6 mm polyps in the sigmoid colon and                            in the descending colon, removed with a cold snare.                            Resected and retrieved.                           - The distal rectum and anal verge are normal on  retroflexion view. Moderate Sedation:      Per Anesthesia Care Recommendation:           - Discharge patient to home (ambulatory).                           - Resume previous diet.                           - Await pathology results.                           - Repeat colonoscopy in 3 years for surveillance. Procedure Code(s):        --- Professional ---                           [phone], Colonoscopy, flexible; with removal of                            tumor(s), polyp(s), or other lesion(s) by snare                            technique Diagnosis Code(s):        --- Professional ---                            Z86.010, Personal history of colonic polyps                           D12.5, Benign neoplasm of sigmoid colon                           D12.4, Benign neoplasm of descending colon CPT copyright 2022 American Medical Association. All rights reserved. The codes documented in this report are preliminary and upon coder review may  be revised to meet current compliance requirements. Maylon Peppers, MD Maylon Peppers,  11/21/2022 2:09:19 PM This report has been signed electronically. Number of Addenda: 0

## 2022-11-21 NOTE — H&P (Signed)
Michael Lyons is an 51 y.o. male.   Chief Complaint: history of multiple colon polyps HPI: Michael Lyons is a 51 y.o. male with past medical history of asthma, sleep apnea (does not wear CPAP as he did not tolerate) coming for history of multiple colon polyps.  No new complaints today.  Last Colonoscopy: May 2023, Dr. Rocco Serene  A digital rectal exam was performed. External hemorrhoids.  - One 5 mm polyp at the hepatic flexure (tubular adenoma)- One 10 mm polyp in the proximal ascending colon (colonic mucosa), - One 4 mm polyp in the proximal transverse colon (tubulovillous adenoma),- Two 3 to 6 mm polyps at 80 cm proximal to the anus (tubular adenoma), - Two 2 to 3 mm polyps in the rectum (tubular adenoma, hyperplastic polyp, colonic mucosa)   Past Medical History:  Diagnosis Date   Asthma     Past Surgical History:  Procedure Laterality Date   APPENDECTOMY     BILATERAL CARPAL TUNNEL RELEASE     CHOLECYSTECTOMY     SHOULDER SURGERY Right 2018    Family History  Problem Relation Age of Onset   Asthma Mother    Heart attack Father    Anemia Neg Hx    Arrhythmia Neg Hx    Clotting disorder Neg Hx    Fainting Neg Hx    Heart disease Neg Hx    Heart failure Neg Hx    Hyperlipidemia Neg Hx    Hypertension Neg Hx    Social History:  reports that he has never smoked. He has been exposed to tobacco smoke. His smokeless tobacco use includes chew. He reports current alcohol use of about 42.0 standard drinks of alcohol per week. He reports that he does not use drugs.  Allergies:  Allergies  Allergen Reactions   Amoxicillin-Pot Clavulanate Nausea And Vomiting    Medications Prior to Admission  Medication Sig Dispense Refill   Mepolizumab (NUCALA) 100 MG/ML SOAJ Inject into the skin every 30 (thirty) days.     montelukast (SINGULAIR) 10 MG tablet Take 10 mg by mouth at bedtime. Takes as needed     testosterone cypionate (DEPOTESTOSTERONE CYPIONATE) 200 MG/ML injection every 14  (fourteen) days.     albuterol (PROVENTIL) (2.5 MG/3ML) 0.083% nebulizer solution Take 2.5 mg by nebulization every 4 (four) hours as needed for wheezing or shortness of breath.     albuterol (VENTOLIN HFA) 108 (90 Base) MCG/ACT inhaler Inhale 2 puffs into the lungs every 4 (four) hours as needed for wheezing or shortness of breath. USE AS DIRECTED      No results found for this or any previous visit (from the past 48 hour(s)). No results found.  Review of Systems  Gastrointestinal:  Positive for diarrhea.  All other systems reviewed and are negative.   Blood pressure 130/85, pulse 61, temperature 98.1 F (36.7 C), temperature source Oral, resp. rate 14, SpO2 97 %. Physical Exam  GENERAL: The patient is AO x3, in no acute distress. HEENT: Head is normocephalic and atraumatic. EOMI are intact. Mouth is well hydrated and without lesions. NECK: Supple. No masses LUNGS: Clear to auscultation. No presence of rhonchi/wheezing/rales. Adequate chest expansion HEART: RRR, normal s1 and s2. ABDOMEN: Soft, nontender, no guarding, no peritoneal signs, and nondistended. BS +. No masses. EXTREMITIES: Without any cyanosis, clubbing, rash, lesions or edema. NEUROLOGIC: AOx3, no focal motor deficit. SKIN: no jaundice, no rashes  Assessment/Plan Michael Lyons is a 51 y.o. male with past medical history of asthma, sleep apnea (does  not wear CPAP as he did not tolerate) coming for history of multiple colon polyps.  Will proceed with colonoscopy today.  Harvel Quale, MD 11/21/2022, 12:40 PM

## 2022-11-21 NOTE — Discharge Instructions (Addendum)
You are being discharged to home.  Resume your previous diet.  We are waiting for your pathology results.  Your physician has recommended a repeat colonoscopy in three years for surveillance.  

## 2022-11-21 NOTE — Transfer of Care (Signed)
Immediate Anesthesia Transfer of Care Note  Patient: Michael Lyons  Procedure(s) Performed: COLONOSCOPY WITH PROPOFOL POLYPECTOMY INTESTINAL  Patient Location: PACU  Anesthesia Type:MAC  Level of Consciousness: awake and patient cooperative  Airway & Oxygen Therapy: Patient Spontanous Breathing  Post-op Assessment: Report given to RN, Post -op Vital signs reviewed and stable, and Patient moving all extremities  Post vital signs: Reviewed and stable  Last Vitals:  Vitals Value Taken Time  BP 87/57 11/21/22 1407  Temp 36.4 C 11/21/22 1407  Pulse 72 11/21/22 1407  Resp 17 11/21/22 1407  SpO2 92 % 11/21/22 1407    Last Pain:  Vitals:   11/21/22 1407  TempSrc: Oral  PainSc:          Complications: No notable events documented.

## 2022-11-21 NOTE — Anesthesia Preprocedure Evaluation (Addendum)
Anesthesia Evaluation  Patient identified by MRN, date of birth, ID band Patient awake    Reviewed: Allergy & Precautions, H&P , NPO status , Patient's Chart, lab work & pertinent test results  History of Anesthesia Complications (+) history of anesthetic complications (patient was told that he stopped breatning severl times during last colonoscopy)  Airway Mallampati: III  TM Distance: >3 FB Neck ROM: Full    Dental  (+) Dental Advisory Given, Chipped,    Pulmonary asthma , sleep apnea    Pulmonary exam normal breath sounds clear to auscultation       Cardiovascular negative cardio ROS Normal cardiovascular exam Rhythm:Regular Rate:Normal     Neuro/Psych negative neurological ROS  negative psych ROS   GI/Hepatic negative GI ROS, Neg liver ROS,,,  Endo/Other  negative endocrine ROS    Renal/GU negative Renal ROS  negative genitourinary   Musculoskeletal negative musculoskeletal ROS (+)    Abdominal   Peds negative pediatric ROS (+)  Hematology negative hematology ROS (+)   Anesthesia Other Findings   Reproductive/Obstetrics negative OB ROS                             Anesthesia Physical Anesthesia Plan  ASA: 3  Anesthesia Plan: General   Post-op Pain Management: Minimal or no pain anticipated   Induction: Intravenous  PONV Risk Score and Plan: 1 and Propofol infusion  Airway Management Planned: Nasal Cannula, Natural Airway and Simple Face Mask  Additional Equipment:   Intra-op Plan:   Post-operative Plan:   Informed Consent: I have reviewed the patients History and Physical, chart, labs and discussed the procedure including the risks, benefits and alternatives for the proposed anesthesia with the patient or authorized representative who has indicated his/her understanding and acceptance.     Dental advisory given  Plan Discussed with: CRNA and Surgeon  Anesthesia  Plan Comments:        Anesthesia Quick Evaluation

## 2022-11-21 NOTE — Anesthesia Postprocedure Evaluation (Signed)
Anesthesia Post Note  Patient: Michael Lyons  Procedure(s) Performed: COLONOSCOPY WITH PROPOFOL POLYPECTOMY INTESTINAL  Patient location during evaluation: Phase II Anesthesia Type: General Level of consciousness: awake and alert and oriented Pain management: pain level controlled Vital Signs Assessment: post-procedure vital signs reviewed and stable Respiratory status: spontaneous breathing, nonlabored ventilation and respiratory function stable Cardiovascular status: blood pressure returned to baseline and stable Postop Assessment: no apparent nausea or vomiting Anesthetic complications: no  No notable events documented.   Last Vitals:  Vitals:   11/21/22 1203 11/21/22 1407  BP: 130/85 (!) 87/57  Pulse: 61 72  Resp: 14 17  Temp: 36.7 C (!) 36.4 C  SpO2: 97% 92%    Last Pain:  Vitals:   11/21/22 1414  TempSrc:   PainSc: 0-No pain                 Brytney Somes C Dre Gamino

## 2022-11-22 ENCOUNTER — Encounter (INDEPENDENT_AMBULATORY_CARE_PROVIDER_SITE_OTHER): Payer: Self-pay | Admitting: *Deleted

## 2022-11-23 LAB — SURGICAL PATHOLOGY

## 2022-11-24 DIAGNOSIS — E291 Testicular hypofunction: Secondary | ICD-10-CM | POA: Diagnosis not present

## 2022-11-28 ENCOUNTER — Encounter (HOSPITAL_COMMUNITY): Payer: Self-pay | Admitting: Gastroenterology

## 2022-12-14 DIAGNOSIS — E291 Testicular hypofunction: Secondary | ICD-10-CM | POA: Diagnosis not present

## 2024-03-14 ENCOUNTER — Ambulatory Visit: Payer: BC Managed Care – PPO | Admitting: Urology

## 2024-05-09 ENCOUNTER — Ambulatory Visit: Admitting: Urology

## 2024-05-09 ENCOUNTER — Encounter: Payer: Self-pay | Admitting: Urology

## 2024-05-09 VITALS — BP 125/73 | HR 69

## 2024-05-09 DIAGNOSIS — E291 Testicular hypofunction: Secondary | ICD-10-CM

## 2024-05-09 NOTE — Progress Notes (Signed)
 05/09/2024 11:32 AM   Michael Lyons October 23, 1971 [id number]  Referring provider: Levada Raymond, PA [address] [place],  [place] [address]  hypogonadism   HPI: Mr Bastyr is a 53yo here for evaluation of hypogonadism. He has been on IM testosterone  200mg  every 2 weeks. His energy has been fair. He has mood swings with the injections. His energy is fair. IPSS 10 QOl 2 on no BPH therapy. He has post void dribbling and occasional urinary urgency.    PMH: Past Medical History:  Diagnosis Date   Asthma     Surgical History: Past Surgical History:  Procedure Laterality Date   APPENDECTOMY     BILATERAL CARPAL TUNNEL RELEASE     CHOLECYSTECTOMY     COLONOSCOPY WITH PROPOFOL  N/A 11/21/2022   Procedure: COLONOSCOPY WITH PROPOFOL ;  Surgeon: Urban Garden, MD;  [facility];  Service: Gastroenterology;  Laterality: N/A;  145 ASA 2   POLYPECTOMY  11/21/2022   Procedure: POLYPECTOMY INTESTINAL;  Surgeon: Urban Garden, MD;  [facility];  Service: Gastroenterology;;   SHOULDER SURGERY Right 2018    Home Medications:  Allergies as of 05/09/2024       Reactions   Amoxicillin-pot Clavulanate Nausea And Vomiting        Medication List        Accurate as of May 09, 2024 11:32 AM. If you have any questions, ask your nurse or doctor.          montelukast 10 MG tablet Commonly known as: SINGULAIR Take 10 mg by mouth at bedtime. Takes as needed   Nucala 100 MG/ML Soaj Generic drug: Mepolizumab Inject into the skin every 30 (thirty) days.   testosterone  cypionate 200 MG/ML injection Commonly known as: DEPOTESTOSTERONE CYPIONATE every 14 (fourteen) days.   Ventolin HFA 108 (90 Base) MCG/ACT inhaler Generic drug: albuterol Inhale 2 puffs into the lungs every 4 (four) hours as needed for wheezing or shortness of breath. USE AS DIRECTED   albuterol (2.5 MG/3ML) 0.083% nebulizer solution Commonly known as: PROVENTIL Take  2.5 mg by nebulization every 4 (four) hours as needed for wheezing or shortness of breath.        Allergies:  Allergies  Allergen Reactions   Amoxicillin-Pot Clavulanate Nausea And Vomiting    Family History: Family History  Problem Relation Age of Onset   Asthma Mother    Heart attack Father    Anemia Neg Hx    Arrhythmia Neg Hx    Clotting disorder Neg Hx    Fainting Neg Hx    Heart disease Neg Hx    Heart failure Neg Hx    Hyperlipidemia Neg Hx    Hypertension Neg Hx     Social History:  reports that he has never smoked. He has been exposed to tobacco smoke. His smokeless tobacco use includes chew. He reports current alcohol use of about 42.0 standard drinks of alcohol per week. He reports that he does not use drugs.  ROS: All other review of systems were reviewed and are negative except what is noted above in HPI  Physical Exam: BP 125/73   Pulse 69   Constitutional:  Alert and oriented, No acute distress. HEENT: [place] AT, moist mucus membranes.  Trachea midline, no masses. Cardiovascular: No clubbing, cyanosis, or edema. Respiratory: Normal respiratory effort, no increased work of breathing. GI: Abdomen is soft, nontender, nondistended, no abdominal masses GU: No CVA tenderness.  Lymph: No cervical or inguinal lymphadenopathy.  Skin: No rashes, bruises or suspicious lesions. Neurologic: Grossly intact, no focal deficits, moving all 4 extremities. Psychiatric: Normal mood and affect.  Laboratory Data: Lab Results  Component Value Date   WBC 7.0 10/02/2019   HGB 14.9 10/02/2019   HCT 44.2 10/02/2019   MCV 90 10/02/2019   PLT 237 10/02/2019    Lab Results  Component Value Date   CREATININE 0.80 08/05/2020    No results found for: "PSA"  No results found for: "TESTOSTERONE "  No results found for: "HGBA1C"  Urinalysis No results found for: "COLORURINE", "APPEARANCEUR", "LABSPEC", "PHURINE", "GLUCOSEU", "HGBUR", "BILIRUBINUR", "KETONESUR", "PROTEINUR",  "UROBILINOGEN", "NITRITE", "LEUKOCYTESUR"  No results found for: "LABMICR", "WBCUA", "RBCUA", "LABEPIT", "MUCUS", "BACTERIA"  Pertinent Imaging:  No results found for this or any previous visit.  No results found for this or any previous visit.  No results found for this or any previous visit.  No results found for this or any previous visit.  No results found for this or any previous visit.  No results found for this or any previous visit.  No results found for this or any previous visit.  No results found for this or any previous visit.   Assessment & Plan:    1. 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  Johnie Nailer, MD  [facility] [place]

## 2024-05-09 NOTE — Patient Instructions (Signed)

## 2024-05-14 ENCOUNTER — Telehealth: Payer: Self-pay

## 2024-05-14 NOTE — Telephone Encounter (Signed)
 Called back about RX he still has not received

## 2024-05-14 NOTE — Telephone Encounter (Signed)
 Pt called b/c clomid Rx was never [facility]

## 2024-05-15 MED ORDER — CLOMIPHENE CITRATE 50 MG PO TABS: 25.0000 mg | ORAL_TABLET | Freq: Every day | ORAL | 11 refills | Status: DC

## 2024-05-15 NOTE — Telephone Encounter (Signed)
 Rx sent.

## 2024-05-15 NOTE — Addendum Note (Signed)
 Addended byRoselee Cong on: 05/15/2024 08:28 AM   Modules accepted: Orders

## 2024-05-15 NOTE — Telephone Encounter (Signed)
 Patient called and made aware a PA has been initiated and someone will reach out once a decision is made.  Medication prior authorization request received.  Completed PA request through cover my meds for drug CLOMID. KEY: [id number]  Approved: Pending

## 2024-08-12 ENCOUNTER — Other Ambulatory Visit

## 2024-08-12 DIAGNOSIS — E291 Testicular hypofunction: Secondary | ICD-10-CM

## 2024-08-16 LAB — CBC
Hematocrit: 45.8 % (ref 37.5–51.0)
Hemoglobin: 15.1 g/dL (ref 13.0–17.7)
MCH: 30.6 pg (ref 26.6–33.0)
MCHC: 33 g/dL (ref 31.5–35.7)
MCV: 93 fL (ref 79–97)
Platelets: 192 x10E3/uL (ref 150–450)
RBC: 4.93 x10E6/uL (ref 4.14–5.80)
RDW: 13.9 % (ref 11.6–15.4)
WBC: 7.8 x10E3/uL (ref 3.4–10.8)

## 2024-08-16 LAB — COMPREHENSIVE METABOLIC PANEL WITH GFR
ALT: 24 IU/L (ref 0–44)
AST: 21 IU/L (ref 0–40)
Albumin: 4.1 g/dL (ref 3.8–4.9)
Alkaline Phosphatase: 58 IU/L (ref 44–121)
BUN/Creatinine Ratio: 12 (ref 9–20)
BUN: 11 mg/dL (ref 6–24)
Bilirubin Total: 0.6 mg/dL (ref 0.0–1.2)
CO2: 22 mmol/L (ref 20–29)
Calcium: 9 mg/dL (ref 8.7–10.2)
Chloride: 101 mmol/L (ref 96–106)
Creatinine, Ser: 0.89 mg/dL (ref 0.76–1.27)
Globulin, Total: 2.4 g/dL (ref 1.5–4.5)
Glucose: 96 mg/dL (ref 70–99)
Potassium: 4.1 mmol/L (ref 3.5–5.2)
Sodium: 138 mmol/L (ref 134–144)
Total Protein: 6.5 g/dL (ref 6.0–8.5)
eGFR: 102 mL/min/1.73 (ref >59)

## 2024-08-16 LAB — ESTRADIOL: Estradiol: 32 pg/mL (ref 7.6–42.6)

## 2024-08-16 LAB — TESTOSTERONE,FREE AND TOTAL
Testosterone, Free: 10.4 pg/mL (ref 7.2–24.0)
Testosterone: 380 ng/dL (ref 264–916)

## 2024-08-16 LAB — PROLACTIN: Prolactin: 6.2 ng/mL (ref 3.6–25.2)

## 2024-08-16 LAB — PSA, TOTAL AND FREE
PSA, Free Pct: 28.6 %
PSA, Free: 0.2 ng/mL
Prostate Specific Ag, Serum: 0.7 ng/mL (ref 0.0–4.0)

## 2024-08-20 ENCOUNTER — Encounter: Payer: Self-pay | Admitting: Urology

## 2024-08-20 ENCOUNTER — Ambulatory Visit: Admitting: Urology

## 2024-08-20 VITALS — BP 125/76 | HR 66

## 2024-08-20 DIAGNOSIS — E291 Testicular hypofunction: Secondary | ICD-10-CM | POA: Diagnosis not present

## 2024-08-20 MED ORDER — CLOMIPHENE CITRATE 50 MG PO TABS: 25.0000 mg | ORAL_TABLET | Freq: Every day | ORAL | 11 refills | Status: DC

## 2024-08-20 NOTE — Patient Instructions (Signed)
 Hypogonadism, Male  Male hypogonadism is a condition of having a level of testosterone  that is lower than normal. Testosterone  is a chemical, or hormone, that is made mainly in the testicles. In boys, testosterone  is responsible for the development of male characteristics during puberty. These include: Making the penis bigger. Growing and building the muscles. Growing facial hair. Deepening the voice. In adult men, testosterone  is responsible for maintaining: An interest in sex and the ability to have sex. Muscle mass. Sperm production. Red blood cell production. Bone strength. Testosterone  also gives men energy and a sense of well-being. Testosterone  normally decreases as men age and the testicles make less testosterone . Testosterone  levels can vary from man to man. Not all men will have signs and symptoms of low testosterone . Weight, alcohol use, medicines, and certain medical conditions can affect a man's testosterone  level. What are the causes? This condition is caused by: A natural decrease in testosterone  that occurs as a man grows older. This is the main cause of this condition. Use of medicines, such as antidepressants, steroids, and opioids. Diseases and conditions that affect the testicles or the making of testosterone . These include: Injury or damage to the testicles from trauma, cancer, cancer treatment, or infection. Diabetes. Sleep apnea. Genetic conditions that men are born with. Disease of the pituitary gland. This gland is in the brain. It produces hormones. Obesity. Metabolic syndrome. This is a group of diseases that affect blood pressure, blood sugar, cholesterol, and belly fat. HIV or AIDS. Alcohol abuse. Kidney failure. Other long-term or chronic diseases. What are the signs or symptoms? Common symptoms of this condition include: Loss of interest in sex (low sex drive). Inability to have or maintain an erection (erectile dysfunction). Feeling tired  (fatigue). Mood changes, like irritability or depression. Loss of muscle and body hair. Infertility. Large breasts. Weight gain (obesity). How is this diagnosed? Your health care provider can diagnose hypogonadism based on: Your signs and symptoms. A physical exam to check your testosterone  levels. This includes blood tests. Testosterone  levels can change throughout the day. Levels are highest in the morning. You may need to have repeat blood tests before getting a diagnosis of hypogonadism. Depending on your medical history and test results, your health care provider may also do other tests to find the cause of low testosterone . How is this treated? This condition is treated with testosterone  replacement therapy. Testosterone  can be given by: Injection or through pellets inserted under the skin. Gels or patches placed on the skin or in the mouth. Testosterone  therapy is not for everyone. It has risks and side effects. Your health care provider will consider your medical history, your risk for prostate cancer, your age, and your symptoms before putting you on testosterone  replacement therapy. Follow these instructions at home: Take over-the-counter and prescription medicines only as told by your health care provider. Eat foods that are high in fiber, such as beans, whole grains, and fresh fruits and vegetables. Limit foods that are high in fat and processed sugars, such as fried or sweet foods. If you drink alcohol: Limit how much you have to 0-2 drinks a day. Know how much alcohol is in your drink. In the [place] one drink equals one 12 oz bottle of beer (355 mL), one 5 oz glass of wine (148 mL), or one 1 oz glass of hard liquor (44 mL). Return to your normal activities as told by your health care provider. Ask your health care provider what activities are safe for you. Keep all  follow-up visits. This is important. Contact a health care provider if: You have any of the signs or symptoms of  low testosterone . You have any side effects from testosterone  therapy. Summary Male hypogonadism is a condition of having a level of testosterone  that is lower than normal. The natural drop in testosterone  production that occurs with age is the most common cause of this condition. Low testosterone  can also be caused by many diseases and conditions that affect the testicles and the making of testosterone . This condition is treated with testosterone  replacement therapy. There are risks and side effects of testosterone  therapy. Your health care provider will consider your age, medical history, symptoms, and risks for prostate cancer before putting you on testosterone  therapy. This information is not intended to replace advice given to you by your health care provider. Make sure you discuss any questions you have with your health care provider. Document Revised: 11/12/2023 Document Reviewed: 11/12/2023 Elsevier Patient Education  2025 [employer].

## 2024-08-20 NOTE — Progress Notes (Signed)
 08/20/2024 2:45 PM   Michael Lyons 1971-01-11 [id number]  Referring provider: Job Bolt, PA [address] [place],  [place] [address]  hypogonadism   HPI: [name] is a 53yo here for followup for hypogonadism. Testosterone  380. Estradiol  normal. Cmp normal. PSA normal. His erections are better on the clomid . NO significant LUTS. No other complaints today   PMH: Past Medical History:  Diagnosis Date   Asthma     Surgical History: Past Surgical History:  Procedure Laterality Date   APPENDECTOMY     BILATERAL CARPAL TUNNEL RELEASE     CHOLECYSTECTOMY     COLONOSCOPY WITH PROPOFOL  N/A 11/21/2022   Procedure: COLONOSCOPY WITH PROPOFOL ;  Surgeon: Michael Angelia Sieving, MD;  [facility];  Service: Gastroenterology;  Laterality: N/A;  145 ASA 2   POLYPECTOMY  11/21/2022   Procedure: POLYPECTOMY INTESTINAL;  Surgeon: Michael Angelia Sieving, MD;  [facility];  Service: Gastroenterology;;   SHOULDER SURGERY Right 2018    Home Medications:  Allergies as of 08/20/2024       Reactions   Amoxicillin-pot Clavulanate Nausea And Vomiting        Medication List        Accurate as of August 20, 2024  2:45 PM. If you have any questions, ask your nurse or doctor.          clomiPHENE  50 MG tablet Commonly known as: CLOMID  Take 0.5 tablets (25 mg total) by mouth daily.   montelukast 10 MG tablet Commonly known as: SINGULAIR Take 10 mg by mouth at bedtime. Takes as needed   Nucala 100 MG/ML Soaj Generic drug: Mepolizumab Inject into the skin every 30 (thirty) days.   testosterone  cypionate 200 MG/ML injection Commonly known as: DEPOTESTOSTERONE CYPIONATE every 14 (fourteen) days.   Ventolin HFA 108 (90 Base) MCG/ACT inhaler Generic drug: albuterol Inhale 2 puffs into the lungs every 4 (four) hours as needed for wheezing or shortness of breath. USE AS DIRECTED   albuterol (2.5 MG/3ML) 0.083% nebulizer solution Commonly known as:  PROVENTIL Take 2.5 mg by nebulization every 4 (four) hours as needed for wheezing or shortness of breath.        Allergies:  Allergies  Allergen Reactions   Amoxicillin-Pot Clavulanate Nausea And Vomiting    Family History: Family History  Problem Relation Age of Onset   Asthma Mother    Heart attack Father    Anemia Neg Hx    Arrhythmia Neg Hx    Clotting disorder Neg Hx    Fainting Neg Hx    Heart disease Neg Hx    Heart failure Neg Hx    Hyperlipidemia Neg Hx    Hypertension Neg Hx     Social History:  reports that he has never smoked. He has been exposed to tobacco smoke. His smokeless tobacco use includes chew. He reports current alcohol use of about 42.0 standard drinks of alcohol per week. He reports that he does not use drugs.  ROS: All other review of systems were reviewed and are negative except what is noted above in HPI  Physical Exam: BP 125/76   Pulse 66   Constitutional:  Alert and oriented, No acute distress. HEENT: [place] AT, moist mucus membranes.  Trachea midline, no masses. Cardiovascular: No clubbing, cyanosis, or edema. Respiratory: Normal respiratory effort, no increased work of breathing. GI: Abdomen is soft, nontender, nondistended, no abdominal masses GU: No CVA tenderness.  Lymph: No cervical or inguinal lymphadenopathy. Skin: No rashes, bruises or  suspicious lesions. Neurologic: Grossly intact, no focal deficits, moving all 4 extremities. Psychiatric: Normal mood and affect.  Laboratory Data: Lab Results  Component Value Date   WBC 7.8 08/12/2024   HGB 15.1 08/12/2024   HCT 45.8 08/12/2024   MCV 93 08/12/2024   PLT 192 08/12/2024    Lab Results  Component Value Date   CREATININE 0.89 08/12/2024    No results found for: PSA  Lab Results  Component Value Date   TESTOSTERONE  380 08/12/2024    No results found for: HGBA1C  Urinalysis No results found for: COLORURINE, APPEARANCEUR, LABSPEC, PHURINE, GLUCOSEU,  HGBUR, BILIRUBINUR, KETONESUR, PROTEINUR, UROBILINOGEN, NITRITE, LEUKOCYTESUR  No results found for: LABMICR, WBCUA, RBCUA, LABEPIT, MUCUS, BACTERIA  Pertinent Imaging:  No results found for this or any previous visit.  No results found for this or any previous visit.  No results found for this or any previous visit.  No results found for this or any previous visit.  No results found for this or any previous visit.  No results found for this or any previous visit.  No results found for this or any previous visit.  No results found for this or any previous visit.   Assessment & Plan:    1. 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  Belvie Clara, MD  [facility] [place]

## 2024-09-12 ENCOUNTER — Encounter: Admitting: Rheumatology

## 2024-10-13 ENCOUNTER — Ambulatory Visit: Admitting: Rheumatology

## 2024-10-23 ENCOUNTER — Encounter (INDEPENDENT_AMBULATORY_CARE_PROVIDER_SITE_OTHER): Payer: Self-pay | Admitting: Gastroenterology

## 2024-12-01 ENCOUNTER — Other Ambulatory Visit

## 2024-12-01 DIAGNOSIS — E291 Testicular hypofunction: Secondary | ICD-10-CM

## 2024-12-02 LAB — COMPREHENSIVE METABOLIC PANEL WITH GFR
ALT: 21 IU/L (ref 0–44)
AST: 20 IU/L (ref 0–40)
Albumin: 4.1 g/dL (ref 3.8–4.9)
Alkaline Phosphatase: 71 IU/L (ref 47–123)
BUN/Creatinine Ratio: 13 (ref 9–20)
BUN: 11 mg/dL (ref 6–24)
Bilirubin Total: 0.4 mg/dL (ref 0.0–1.2)
CO2: 23 mmol/L (ref 20–29)
Calcium: 8.9 mg/dL (ref 8.7–10.2)
Chloride: 101 mmol/L (ref 96–106)
Creatinine, Ser: 0.88 mg/dL (ref 0.76–1.27)
Globulin, Total: 2.5 g/dL (ref 1.5–4.5)
Glucose: 160 mg/dL — ABNORMAL HIGH (ref 70–99)
Potassium: 4.3 mmol/L (ref 3.5–5.2)
Sodium: 141 mmol/L (ref 134–144)
Total Protein: 6.6 g/dL (ref 6.0–8.5)
eGFR: 103 mL/min/1.73 (ref >59)

## 2024-12-02 LAB — ESTRADIOL: Estradiol: 41.5 pg/mL (ref 7.6–42.6)

## 2024-12-02 LAB — TESTOSTERONE,FREE AND TOTAL
Testosterone, Free: 15.4 pg/mL (ref 7.2–24.0)
Testosterone: 360 ng/dL (ref 264–916)

## 2024-12-02 LAB — CBC
Hematocrit: 46.9 % (ref 37.5–51.0)
Hemoglobin: 15.6 g/dL (ref 13.0–17.7)
MCH: 31.1 pg (ref 26.6–33.0)
MCHC: 33.3 g/dL (ref 31.5–35.7)
MCV: 93 fL (ref 79–97)
Platelets: 218 x10E3/uL (ref 150–450)
RBC: 5.02 x10E6/uL (ref 4.14–5.80)
RDW: 12.1 % (ref 11.6–15.4)
WBC: 7.9 x10E3/uL (ref 3.4–10.8)

## 2024-12-24 ENCOUNTER — Ambulatory Visit: Admitting: Urology

## 2024-12-24 ENCOUNTER — Encounter: Payer: Self-pay | Admitting: Urology

## 2024-12-24 VITALS — BP 122/76 | HR 66

## 2024-12-24 DIAGNOSIS — N3943 Post-void dribbling: Secondary | ICD-10-CM | POA: Diagnosis not present

## 2024-12-24 DIAGNOSIS — E291 Testicular hypofunction: Secondary | ICD-10-CM

## 2024-12-24 MED ORDER — ALFUZOSIN HCL ER 10 MG PO TB24: 10.0000 mg | ORAL_TABLET | Freq: Every day | ORAL | 11 refills | Status: AC

## 2024-12-24 MED ORDER — CLOMIPHENE CITRATE 50 MG PO TABS: 50.0000 mg | ORAL_TABLET | Freq: Every day | ORAL | 11 refills | Status: AC

## 2024-12-24 NOTE — Patient Instructions (Signed)
 Hypogonadism, Male  Male hypogonadism is a condition of having a level of testosterone  that is lower than normal. Testosterone  is a chemical, or hormone, that is made mainly in the testicles. In boys, testosterone  is responsible for the development of male characteristics during puberty. These include: Making the penis bigger. Growing and building the muscles. Growing facial hair. Deepening the voice. In adult men, testosterone  is responsible for maintaining: An interest in sex and the ability to have sex. Muscle mass. Sperm production. Red blood cell production. Bone strength. Testosterone  also gives men energy and a sense of well-being. Testosterone  normally decreases as men age and the testicles make less testosterone . Testosterone  levels can vary from man to man. Not all men will have signs and symptoms of low testosterone . Weight, alcohol use, medicines, and certain medical conditions can affect a man's testosterone  level. What are the causes? This condition is caused by: A natural decrease in testosterone  that occurs as a man grows older. This is the main cause of this condition. Use of medicines, such as antidepressants, steroids, and opioids. Diseases and conditions that affect the testicles or the making of testosterone . These include: Injury or damage to the testicles from trauma, cancer, cancer treatment, or infection. Diabetes. Sleep apnea. Genetic conditions that men are born with. Disease of the pituitary gland. This gland is in the brain. It produces hormones. Obesity. Metabolic syndrome. This is a group of diseases that affect blood pressure, blood sugar, cholesterol, and belly fat. HIV or AIDS. Alcohol abuse. Kidney failure. Other long-term or chronic diseases. What are the signs or symptoms? Common symptoms of this condition include: Loss of interest in sex (low sex drive). Inability to have or maintain an erection (erectile dysfunction). Feeling tired  (fatigue). Mood changes, like irritability or depression. Loss of muscle and body hair. Infertility. Large breasts. Weight gain (obesity). How is this diagnosed? Your health care provider can diagnose hypogonadism based on: Your signs and symptoms. A physical exam to check your testosterone  levels. This includes blood tests. Testosterone  levels can change throughout the day. Levels are highest in the morning. You may need to have repeat blood tests before getting a diagnosis of hypogonadism. Depending on your medical history and test results, your health care provider may also do other tests to find the cause of low testosterone . How is this treated? This condition is treated with testosterone  replacement therapy. Testosterone  can be given by: Injection or through pellets inserted under the skin. Gels or patches placed on the skin or in the mouth. Testosterone  therapy is not for everyone. It has risks and side effects. Your health care provider will consider your medical history, your risk for prostate cancer, your age, and your symptoms before putting you on testosterone  replacement therapy. Follow these instructions at home: Take over-the-counter and prescription medicines only as told by your health care provider. Eat foods that are high in fiber, such as beans, whole grains, and fresh fruits and vegetables. Limit foods that are high in fat and processed sugars, such as fried or sweet foods. If you drink alcohol: Limit how much you have to 0-2 drinks a day. Know how much alcohol is in your drink. In the [place] one drink equals one 12 oz bottle of beer (355 mL), one 5 oz glass of wine (148 mL), or one 1 oz glass of hard liquor (44 mL). Return to your normal activities as told by your health care provider. Ask your health care provider what activities are safe for you. Keep all  follow-up visits. This is important. Contact a health care provider if: You have any of the signs or symptoms of  low testosterone . You have any side effects from testosterone  therapy. Summary Male hypogonadism is a condition of having a level of testosterone  that is lower than normal. The natural drop in testosterone  production that occurs with age is the most common cause of this condition. Low testosterone  can also be caused by many diseases and conditions that affect the testicles and the making of testosterone . This condition is treated with testosterone  replacement therapy. There are risks and side effects of testosterone  therapy. Your health care provider will consider your age, medical history, symptoms, and risks for prostate cancer before putting you on testosterone  therapy. This information is not intended to replace advice given to you by your health care provider. Make sure you discuss any questions you have with your health care provider. Document Revised: 11/12/2023 Document Reviewed: 11/12/2023 Elsevier Patient Education  2025 [employer].

## 2024-12-24 NOTE — Progress Notes (Signed)
 "  12/24/2024 3:29 PM   Michael Lyons 1971/11/16 [id number]  Referring provider: Job Bolt, PA [address] [place],  [place] [address]  Followup hypogonadism   HPI: Michael Lyons is a 53yo here for followup for hypogonadism and post void dribbling. Testosterone  360. Hemoglobin 15.6. CMP normal. Estradiol  normal. He continue to have fatigue. He notes the post void dribbling has worsened since last visit. IPSS 11 QOl 3 on no medication. He also notes worsening urinary urgency   PMH: Past Medical History:  Diagnosis Date   Asthma     Surgical History: Past Surgical History:  Procedure Laterality Date   APPENDECTOMY     BILATERAL CARPAL TUNNEL RELEASE     CHOLECYSTECTOMY     COLONOSCOPY WITH PROPOFOL  N/A 11/21/2022   Procedure: COLONOSCOPY WITH PROPOFOL ;  Surgeon: Eartha Angelia Sieving, MD;  [facility];  Service: Gastroenterology;  Laterality: N/A;  145 ASA 2   POLYPECTOMY  11/21/2022   Procedure: POLYPECTOMY INTESTINAL;  Surgeon: Eartha Angelia Sieving, MD;  [facility];  Service: Gastroenterology;;   SHOULDER SURGERY Right 2018    Home Medications:  Allergies as of 12/24/2024       Reactions   Amoxicillin-pot Clavulanate Nausea And Vomiting        Medication List        Accurate as of December 24, 2024  3:29 PM. If you have any questions, ask your nurse or doctor.          clomiPHENE  50 MG tablet Commonly known as: CLOMID  Take 0.5 tablets (25 mg total) by mouth daily.   montelukast 10 MG tablet Commonly known as: SINGULAIR Take 10 mg by mouth at bedtime. Takes as needed   Nucala 100 MG/ML Soaj Generic drug: Mepolizumab Inject into the skin every 30 (thirty) days.   testosterone  cypionate 200 MG/ML injection Commonly known as: DEPOTESTOSTERONE CYPIONATE every 14 (fourteen) days.   Ventolin HFA 108 (90 Base) MCG/ACT inhaler Generic drug: albuterol Inhale 2 puffs into the lungs every 4 (four) hours as needed for wheezing or  shortness of breath. USE AS DIRECTED   albuterol (2.5 MG/3ML) 0.083% nebulizer solution Commonly known as: PROVENTIL Take 2.5 mg by nebulization every 4 (four) hours as needed for wheezing or shortness of breath.        Allergies: Allergies[1]  Family History: Family History  Problem Relation Age of Onset   Asthma Mother    Heart attack Father    Anemia Neg Hx    Arrhythmia Neg Hx    Clotting disorder Neg Hx    Fainting Neg Hx    Heart disease Neg Hx    Heart failure Neg Hx    Hyperlipidemia Neg Hx    Hypertension Neg Hx     Social History:  reports that he has never smoked. He has been exposed to tobacco smoke. His smokeless tobacco use includes chew. He reports current alcohol use of about 42.0 standard drinks of alcohol per week. He reports that he does not use drugs.  ROS: All other review of systems were reviewed and are negative except what is noted above in HPI  Physical Exam: BP 122/76   Pulse 66   Constitutional:  Alert and oriented, No acute distress. HEENT:  AT, moist mucus membranes.  Trachea midline, no masses. Cardiovascular: No clubbing, cyanosis, or edema. Respiratory: Normal respiratory effort, no increased work of breathing. GI: Abdomen is soft, nontender, nondistended, no abdominal masses GU: No CVA tenderness.  Lymph: No cervical  or inguinal lymphadenopathy. Skin: No rashes, bruises or suspicious lesions. Neurologic: Grossly intact, no focal deficits, moving all 4 extremities. Psychiatric: Normal mood and affect.  Laboratory Data: Lab Results  Component Value Date   WBC 7.9 12/01/2024   HGB 15.6 12/01/2024   HCT 46.9 12/01/2024   MCV 93 12/01/2024   PLT 218 12/01/2024    Lab Results  Component Value Date   CREATININE 0.88 12/01/2024    No results found for: PSA  Lab Results  Component Value Date   TESTOSTERONE  360 12/01/2024    No results found for: HGBA1C  Urinalysis No results found for: COLORURINE, APPEARANCEUR,  LABSPEC, PHURINE, GLUCOSEU, HGBUR, BILIRUBINUR, KETONESUR, PROTEINUR, UROBILINOGEN, NITRITE, LEUKOCYTESUR  No results found for: LABMICR, WBCUA, RBCUA, LABEPIT, MUCUS, BACTERIA  Pertinent Imaging:  No results found for this or any previous visit.  No results found for this or any previous visit.  No results found for this or any previous visit.  No results found for this or any previous visit.  No results found for this or any previous visit.  No results found for this or any previous visit.  No results found for this or any previous visit.  No results found for this or any previous visit.   Assessment & Plan:    1. 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 Post-void dribbling We will trial uroxatral  10mg     No follow-ups on file.  Belvie Clara, MD  [facility] [place]       [1]  Allergies Allergen Reactions   Amoxicillin-Pot Clavulanate Nausea And Vomiting   "

## 2025-06-30 ENCOUNTER — Other Ambulatory Visit

## 2025-07-08 ENCOUNTER — Ambulatory Visit: Admitting: Urology
